# Patient Record
Sex: Male | Born: 1940 | Race: White | Hispanic: No | Marital: Single | Smoking: Former smoker
Health system: Southern US, Community
[De-identification: ages and names within clinical notes are randomized; demographics above are authoritative.]

## PROBLEM LIST (undated history)

## (undated) DIAGNOSIS — R600 Localized edema: Secondary | ICD-10-CM

## (undated) DIAGNOSIS — Z7901 Long term (current) use of anticoagulants: Secondary | ICD-10-CM

## (undated) DIAGNOSIS — J309 Allergic rhinitis, unspecified: Secondary | ICD-10-CM

## (undated) DIAGNOSIS — I509 Heart failure, unspecified: Secondary | ICD-10-CM

## (undated) DIAGNOSIS — H3530 Unspecified macular degeneration: Secondary | ICD-10-CM

## (undated) DIAGNOSIS — I482 Chronic atrial fibrillation, unspecified: Secondary | ICD-10-CM

## (undated) DIAGNOSIS — E785 Hyperlipidemia, unspecified: Secondary | ICD-10-CM

## (undated) DIAGNOSIS — I340 Nonrheumatic mitral (valve) insufficiency: Secondary | ICD-10-CM

## (undated) DIAGNOSIS — K648 Other hemorrhoids: Secondary | ICD-10-CM

## (undated) DIAGNOSIS — E039 Hypothyroidism, unspecified: Secondary | ICD-10-CM

## (undated) DIAGNOSIS — G4730 Sleep apnea, unspecified: Secondary | ICD-10-CM

## (undated) HISTORY — DX: Long term (current) use of anticoagulants: Z79.01

## (undated) HISTORY — DX: Unspecified macular degeneration: H35.30

## (undated) HISTORY — DX: Hyperlipidemia, unspecified: E78.5

## (undated) HISTORY — DX: Localized edema: R60.0

## (undated) HISTORY — DX: Heart failure, unspecified: I50.9

## (undated) HISTORY — DX: Chronic atrial fibrillation, unspecified: I48.20

## (undated) HISTORY — DX: Hypothyroidism, unspecified: E03.9

## (undated) HISTORY — DX: Sleep apnea, unspecified: G47.30

## (undated) HISTORY — DX: Other hemorrhoids: K64.8

## (undated) HISTORY — DX: Nonrheumatic mitral (valve) insufficiency: I34.0

## (undated) HISTORY — DX: Allergic rhinitis, unspecified: J30.9

---
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
## 1987-07-08 HISTORY — PX: TURBINATE REDUCTION: SHX6157

## 2000-11-02 ENCOUNTER — Encounter: Payer: Self-pay | Admitting: Internal Medicine

## 2000-11-02 ENCOUNTER — Inpatient Hospital Stay (HOSPITAL_COMMUNITY): Admission: EM | Admit: 2000-11-02 | Discharge: 2000-11-12 | Payer: Self-pay | Admitting: Emergency Medicine

## 2000-11-03 HISTORY — PX: CARDIAC CATHETERIZATION: SHX172

## 2001-01-15 ENCOUNTER — Ambulatory Visit (HOSPITAL_COMMUNITY): Admission: RE | Admit: 2001-01-15 | Discharge: 2001-01-15 | Payer: Self-pay | Admitting: Cardiovascular Disease

## 2001-03-09 HISTORY — PX: DOPPLER ECHOCARDIOGRAPHY: SHX263

## 2001-04-06 DIAGNOSIS — K648 Other hemorrhoids: Secondary | ICD-10-CM

## 2001-04-06 HISTORY — DX: Other hemorrhoids: K64.8

## 2004-09-10 HISTORY — PX: US ECHOCARDIOGRAPHY: HXRAD669

## 2004-09-20 HISTORY — PX: CARDIOVASCULAR STRESS TEST: SHX262

## 2004-10-29 ENCOUNTER — Ambulatory Visit (HOSPITAL_COMMUNITY): Admission: RE | Admit: 2004-10-29 | Discharge: 2004-10-30 | Payer: Self-pay | Admitting: Ophthalmology

## 2005-07-07 HISTORY — PX: EYE SURGERY: SHX253

## 2006-02-15 IMAGING — CR DG CHEST 2V
2 series · 2 of 2 positions shown · non-contrast
Comparison: None.

CLINICAL DATA: Retinal detachment. Preoperative respiratory evaluation. Smoker
with history of hypertension.

CHEST - 2 VIEW  10/29/2004:

[view not recorded (1 of 2)]
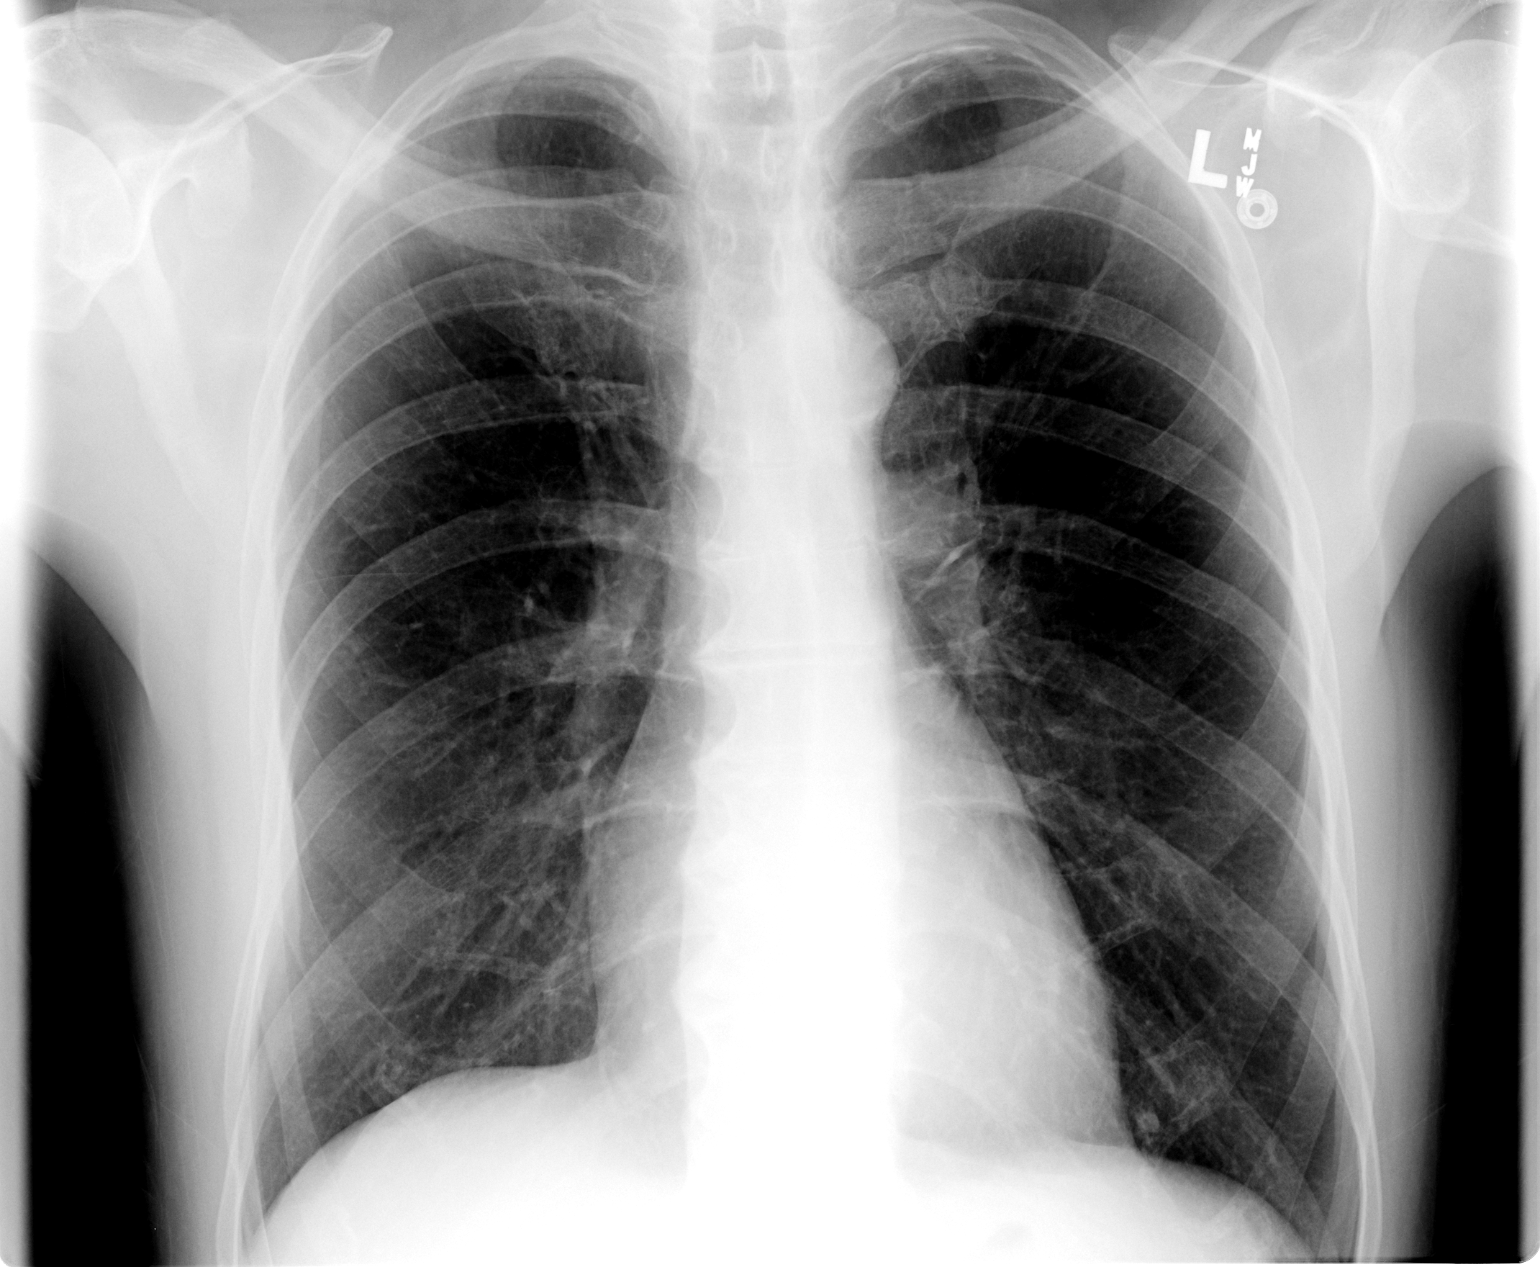

[view not recorded (2 of 2)]
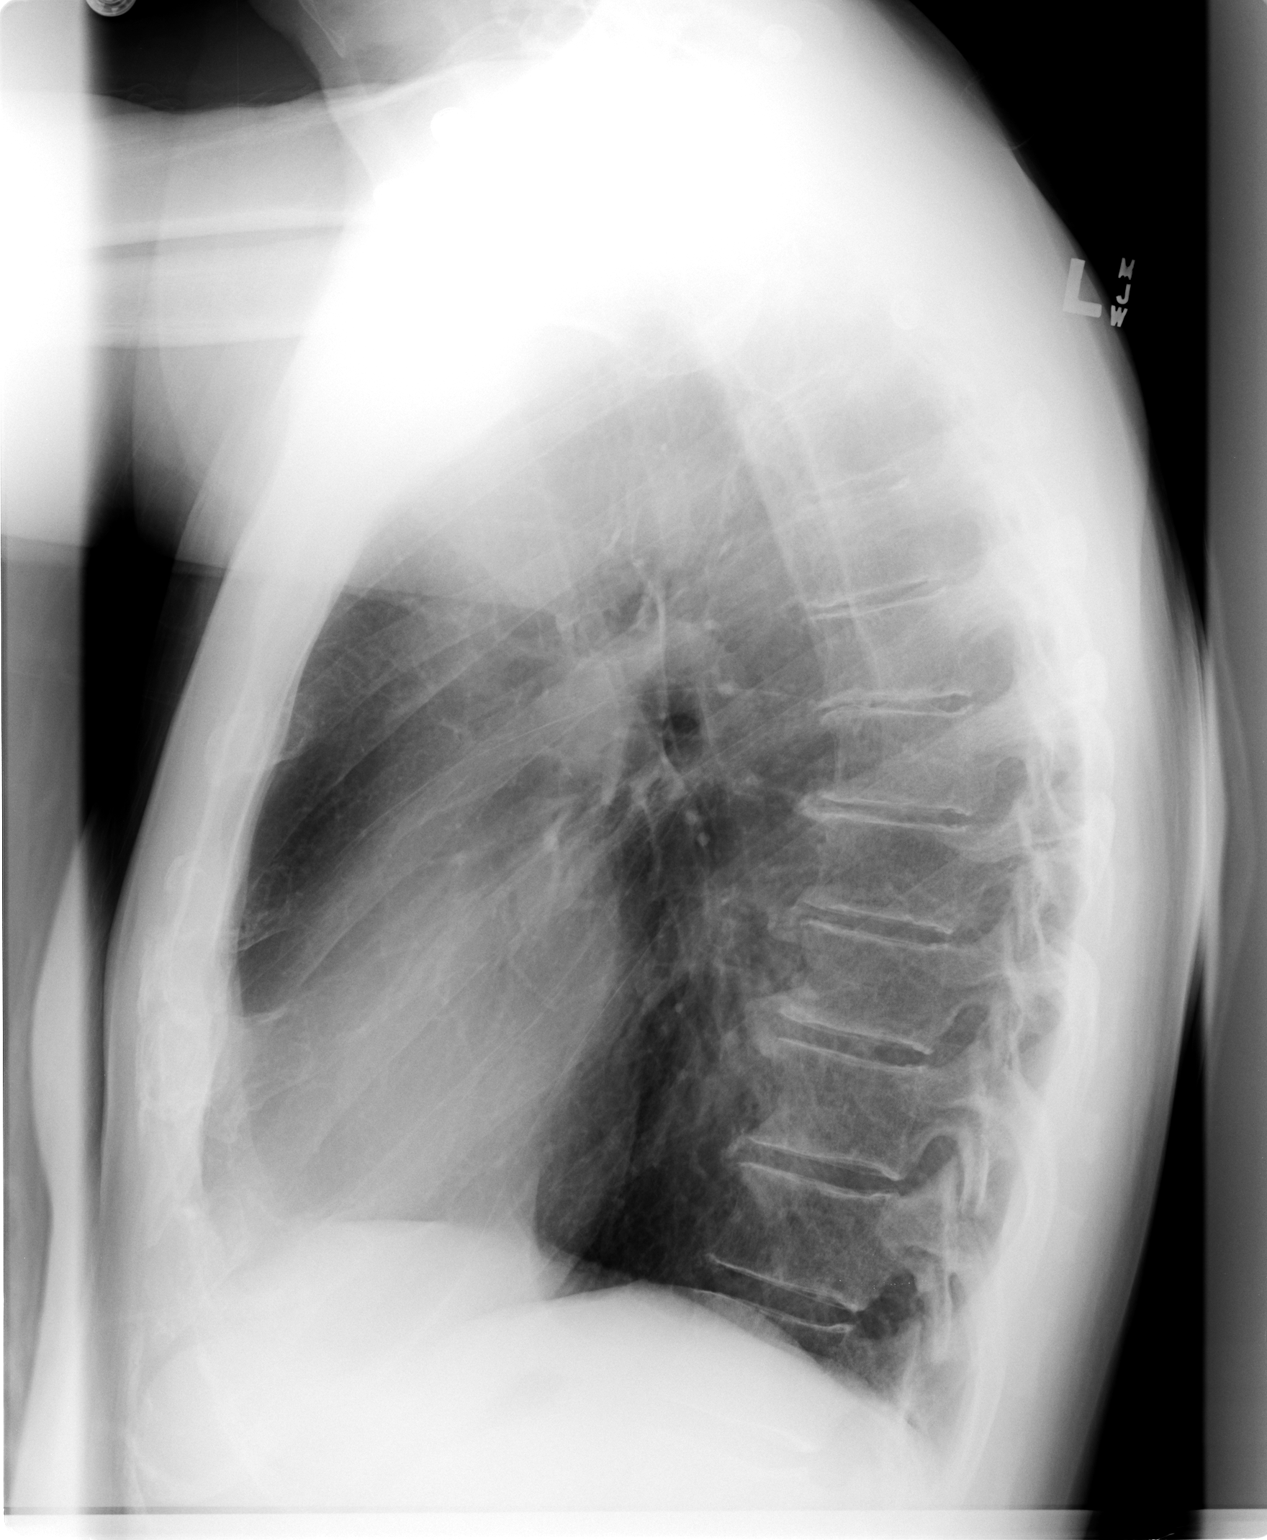

[2 of 2 positions shown; findings below may reference images not displayed]

FINDINGS: The heart size is normal. Mild thoracic aortic atherosclerosis is
present. The hilar and mediastinal contours are otherwise unremarkable. The
lungs are hyperinflated with mildly prominent bronchovascular markings
diffusely. What is either a calcified granuloma or calcification in the anterior
6th rib costal cartilage is noted overlying the left lung base on the PA view.
Degenerative changes are noted in the thoracic spine.
IMPRESSION: COPD. No evidence of acute disease.

## 2007-07-08 HISTORY — PX: OTHER SURGICAL HISTORY: SHX169

## 2010-02-26 ENCOUNTER — Ambulatory Visit: Payer: Self-pay | Admitting: Cardiovascular Disease

## 2010-03-26 ENCOUNTER — Ambulatory Visit: Payer: Self-pay | Admitting: Cardiovascular Disease

## 2010-04-23 ENCOUNTER — Ambulatory Visit: Payer: Self-pay | Admitting: Cardiovascular Disease

## 2010-05-21 ENCOUNTER — Ambulatory Visit: Payer: Self-pay | Admitting: Cardiology

## 2010-06-18 ENCOUNTER — Ambulatory Visit: Payer: Self-pay | Admitting: Cardiology

## 2010-07-22 ENCOUNTER — Ambulatory Visit: Payer: Self-pay | Admitting: Cardiology

## 2010-07-24 ENCOUNTER — Ambulatory Visit: Payer: Self-pay | Admitting: Cardiovascular Disease

## 2010-08-20 ENCOUNTER — Encounter (INDEPENDENT_AMBULATORY_CARE_PROVIDER_SITE_OTHER): Payer: Medicare Other

## 2010-08-20 DIAGNOSIS — Z7901 Long term (current) use of anticoagulants: Secondary | ICD-10-CM

## 2010-08-20 DIAGNOSIS — I4891 Unspecified atrial fibrillation: Secondary | ICD-10-CM

## 2010-09-03 ENCOUNTER — Encounter (INDEPENDENT_AMBULATORY_CARE_PROVIDER_SITE_OTHER): Payer: Medicare Other

## 2010-09-03 DIAGNOSIS — I4891 Unspecified atrial fibrillation: Secondary | ICD-10-CM

## 2010-09-03 DIAGNOSIS — Z7901 Long term (current) use of anticoagulants: Secondary | ICD-10-CM

## 2010-09-27 ENCOUNTER — Other Ambulatory Visit: Payer: Self-pay | Admitting: *Deleted

## 2010-09-27 DIAGNOSIS — I10 Essential (primary) hypertension: Secondary | ICD-10-CM

## 2010-09-27 MED ORDER — METOPROLOL TARTRATE 25 MG PO TABS: ORAL_TABLET | ORAL | Status: DC

## 2010-09-27 NOTE — Telephone Encounter (Signed)
REFILL PER FAX FROM PHARMACY  

## 2010-10-01 ENCOUNTER — Ambulatory Visit (INDEPENDENT_AMBULATORY_CARE_PROVIDER_SITE_OTHER): Payer: Medicare Other | Admitting: *Deleted

## 2010-10-01 DIAGNOSIS — I482 Chronic atrial fibrillation, unspecified: Secondary | ICD-10-CM | POA: Insufficient documentation

## 2010-10-01 DIAGNOSIS — I4891 Unspecified atrial fibrillation: Secondary | ICD-10-CM

## 2010-10-01 DIAGNOSIS — Z7901 Long term (current) use of anticoagulants: Secondary | ICD-10-CM

## 2010-10-01 LAB — POCT INR: INR: 2.2

## 2010-10-18 ENCOUNTER — Other Ambulatory Visit: Payer: Self-pay | Admitting: Cardiovascular Disease

## 2010-10-18 DIAGNOSIS — I10 Essential (primary) hypertension: Secondary | ICD-10-CM

## 2010-10-18 NOTE — Telephone Encounter (Signed)
Fax received from pharmacy. Refill completed. Jodette Mirielle Byrum RN  

## 2010-11-05 ENCOUNTER — Ambulatory Visit (INDEPENDENT_AMBULATORY_CARE_PROVIDER_SITE_OTHER): Payer: Medicare Other | Admitting: *Deleted

## 2010-11-05 DIAGNOSIS — I4891 Unspecified atrial fibrillation: Secondary | ICD-10-CM

## 2010-11-05 LAB — POCT INR: INR: 2.4

## 2010-11-12 ENCOUNTER — Other Ambulatory Visit: Payer: Self-pay | Admitting: Cardiovascular Disease

## 2010-11-12 NOTE — Telephone Encounter (Signed)
Fax received from pharmacy. Refill completed. Jodette Kaimana Lurz RN  

## 2010-11-22 NOTE — Consult Note (Signed)
[place]. [facility]  Patient:    Craig Simon, Craig Simon                      MRN: [id number] Proc. Date: 11/05/00 Adm. Date:  11/05/00 Attending:  Doylene Canning. Ladona Ridgel, M.D. [facility] Dictator:   Chinita Pester, N.P. CC:         Vesta Mixer, Montez Hageman., M.D.  Barry Dienes Eloise Harman, M.D.  Doylene Canning. Ladona Ridgel, M.D. [facility]   Consultation Report  REASON FOR CONSULTATION:  Atrial fibrillation VT.  HISTORY OF PRESENT ILLNESS:  This is a 70 year old gentleman with a past medical history of atrial fibrillation probably chronic since 1988 with controlled rate up until this point.  The patient had elected to be treated with aspirin instead of Coumadin.  He presented to his primary cares office complaining of lower extremity edema, PND, orthopnea, dyspnea on exertion with chest tightness for the past 2-3 weeks.  EKG performed and showed a atrial fibrillation with a rapid ventricular response.  He also developed nonsustained VT, monomorphic runs, which were long.  He was placed on a lidocaine drip.  Catheterization performed showed no critical coronary disease with an EF of 35-40%, mild to moderate MR.  He is asymptomatic with his runs of VT although he becomes hypotensive and diaphoretic later which was thought to be secondary to beta blockers, lidocaine and/or possibly a vagal response post catheterization.  He had a few episodes of presyncope in the past which lasted with the last episode being 5 years ago after drinking "tea".  He denied palpitations at that time.  PAST MEDICAL HISTORY:  Positive for atrial fibrillation and erectile dysfunction.  PAST SURGICAL HISTORY:  None.  SOCIAL HISTORY:  He is engaged.  Negative tobacco or alcohol.  He is employed as a Research scientist (medical).  FAMILY HISTORY:  Positive for CHF and CAD.  MEDICATIONS: 1. Lopressor 25 q.12h. 2. Lasix 40 daily. 3. Digoxin 0.125 daily. 4. Altace 2.5 daily.  Prior to admission he was taking: 5. Viagra 6. Aspirin.  The  patient also in hospital is taking: 7. Aspirin 81 daily 8. K-Dur 20 mEq b.i.d.  ALLERGIES:  No known drug allergies.  REVIEW OF SYSTEMS:  No visual changes.  Cardiovascular:  Positive chest tightness. Respiratory:  Positive dyspnea on exertion, shortness of breath, paroxysmal nocturnal dyspnea on admission.  GI:  No nausea, vomiting, and diarrhea.  GU:  Negative dysuria, negative diabetes.  LABORATORY DATA:  White blood count 10.9, H&H 13.7 and 40.7.  Platelets 137. Sodium 141, potassium 3.6, chloride 106, CO2 29, glucose 117, BUN 16, creatinine 1.3.  Magnesium 1.9, calcium 8.4.  Chest x-ray on November 02, 2000, showed bilateral atelectasis.  Catheterization shows an EF of 35-40% with no critical coronary disease.  Telemetry shows ventricular tachycardia which is monomorphic with a cycle length of 380.  PHYSICAL EXAMINATION:  VITAL SIGNS:  Blood pressure 100/50, pulse 70, respiratory rate 20.  Telemetry shows A fibrillation.  GENERAL:  This a well-developed 70 year old male lying in bed in no acute distress.  HEENT:  Sclerae clear.  NECK:  Supple.  Nontender, no bruits.  CARDIOVASCULAR:  Rate is irregular.  CHEST:  Lungs clear to auscultation bilateral.  ABDOMEN:  Soft, round, nontender.  Normoactive bowel sounds.  EXTREMITIES:  Show no edema.  NEUROLOGIC:  Awake, alert and oriented x 3.  ASSESSMENT: 1. Nonsustained ventricular tachycardia. Cycle length 380, monomorphic, 14    beats, 8 beats x 2 and asymptomatic at the time. 2. 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  Cardiac catheterization had showed no    critical disease. 3. Atrial fibrillation of unknown duration whether rate controlled.  RECOMMENDATIONS: 1. Echocardiogram left ventriculogram. 2. Convert patient to a normal sinus rhythm if possible after anticoagulation    has been obtained which may also improve patients overall ejection     fraction to keep him in a normal sinus rhythm.  If he were fail possibly    dofetilide it may thought to start him on amiodarone with Coumadin    anticoagulation and readmit him for a DC cardioversion in 6-8 weeks with    followup 2-D echocardiograms to see if there is an improvement of his LV    function. DD:  11/05/00 TD:  11/06/00 Job: [id number]

## 2010-11-22 NOTE — Op Note (Signed)
NAMEJAXX, HUISH NO.:  [id number]   MEDICAL RECORD NO.:  [id number]          PATIENT TYPE:  OIB   LOCATION:  5731                         FACILITY:  [facility]   PHYSICIAN:  Guadelupe Sabin, M.D.DATE OF BIRTH:  July 10, 1940   DATE OF PROCEDURE:  10/29/2004  DATE OF DISCHARGE:  10/30/2004                                 OPERATIVE REPORT   PREOPERATIVE DIAGNOSIS:  Rhegmatogenous retinal detachment, right eye; also,  a proliferative vitreoretinopathy.   POSTOPERATIVE DIAGNOSIS:  Rhegmatogenous retinal detachment, right eye;  also, a proliferative vitreoretinopathy.   NAME OF OPERATION:  Pars plana vitrectomy using vitreous infusion suction  cutter, scleral buckling using solid silicone implants #277 and 240,  vitreous-air exchange.   SURGEON:  Guadelupe Sabin, M.D.   ASSISTANT:  Nurse.   ANESTHESIA:  General.   OPHTHALMOSCOPY:  As previously described.   OPERATIVE PROCEDURE:  After the patient was prepped and draped, lid traction  sutures were placed in the right upper and lower lids.  A lid speculum was  inserted.  A peritomy was performed adjacent to the limbus 360 degrees.  The  subconjunctival tissue was cleaned and a 4-0 silk traction sutures placed on  the superior, inferior and medial rectus muscles.  It was felt that the  lateral rectus muscle would need to be removed due to the position of the  retinal break under the lateral rectus muscle.  A 4-0 chromic catgut suture  was placed in the center of the tendon and each side of the muscle tendon  was whipped with the 4-0 chromic suture.  The muscle was then severed from  its anatomic insertion and a bulldog clamp applied to the sutures which were  laid aside.  A 4-0 silk suture was then placed in the tendinous insertion of  the muscle for traction purposes.  Indirect ophthalmoscopy was then  performed revealing a clear vitreous and the temporal retinal detachment.  The large proliferative  vitreoretinal retinopathy star-fold was noted.  The  macular area was still attached.  The sclera was inspected and felt to be  extremely thin.  It was decided that lamellar scleral dissection could not  be carried out in this thin sclera.  The retinal break was then localized  with indirect ophthalmoscopy and treated with cryoapplication.  A #240 solid  silicone encircling band was placed about the globe approximately at the  equator and anchored in place with 5-0 white sutures at the 2, 8 and 4  o'clock position.  A #277 solid silicone implant was then placed under the  240 band extending from the 8:30 to 11 o'clock position.  A total of three 4-  0 green and one 5-0 white Dacron sutures were placed with 3-0 catgut  reinforcements loosely over the implant this well.  Attention was then paid  to the anterior segment and the ocular microscope aligned for vitrectomy  surgery.  Three sclerotomy sites were then prepared at the 10, 2 and 8  o'clock position 3.5 mm from the limbus using the MVR blade.  The 4-mm  vitreous infusion terminal was secured in place  at the 8 o'clock position  with a 5-0 green Mersilene suture.  The fiberoptic light pipe was inserted  at the 2 o'clock position and the handpiece of the vitreous infusion suction  cutter at the 10 o'clock position.  Slow vitreous infusion and suction  cutting were begun from an anterior direction toward the retina.  After the  anterior vitreous, which was noted to be extremely fibrinous, was partially  removed, it was elected to insert Perfluoron fluid to stabilize the macula  and posterior pole.  Further vitrectomy removal was performed.  Gradually,  the retinal breaks could be seen on the temporal side and attention was  particularly paid to remove the vitreous traction from the tear itself.  The  horseshoe tear flap was then amputated from its tractional pulling of  vitreous forming a round hole without the vitreous traction.  It was  then  elected to increase the Perfluoron level to the level just posterior to the  retinal break; this was performed and then a Perfluoron-air exchange  performed.  The sclerotomy sites were temporarily closed and with indirect  ophthalmoscopy, the number 277 band move posteriorly and was inspected; it  was felt that this was in good position under the implant.  It was then  elected to secure and tightened the mattress sutures which had been placed  covering the implant, creating a scleral indentation.  The tension of the  encircling #240 silicone band was also adjusted.  The band itself was tied  with 2 sutures at the 8 o'clock position.  A good buckling indentation was  created and the fundus inspected revealing no further Perfluoron.  A small  amount of subretinal fluid was noted just posterior to the implant, but this  did not extend to the posterior pole and macular area.  The vitreous  instruments were withdrawn and the sclerotomy sites are closed with 7-0  Vicryl sutures.  The lateral rectus muscle was then reattached.  Tenon's  capsule was pulled forward in the 4 quadrants and tied as a separate layer  with a 6-0 chromic catgut suture.  The conjunctiva was then pulled forward  and closed with a running 6-0 chromic suture.  Neosporin ophthalmic solution  was irrigated under the Tenon's capsule and around the implants.  The  conjunctiva was closed and prior to conjunctival closure, Depo-Garamycin and  dexamethasone were injected in the sub-tenon space inferiorly.  Maxitrol and  atropine ointment were instilled in the conjunctival cul-de-sac.  Duration  of procedure and anesthesia administration -- 2 hours.  The patient  tolerated the procedure well in general and left the operating room for the  recovery room and subsequently to the 23-hour observation unit.  The patient  was to be placed on his left side to allow proper positioning of the intravitreal air bubble.      HNJ/MEDQ   D:  10/31/2004  T:  11/01/2004  Job:  [id number]   cc:   Charlton Haws, M.D.   Estelle June. Neva Seat, M.D.  [address] [place]  [place] [address]  Fax: [phone]

## 2010-11-22 NOTE — Discharge Summary (Signed)
[place]. [facility]  Patient:    Craig Simon, Craig Simon                      MRN: [id number] Adm. Date:  [id number] Disc. Date: [id number] Attending:  Koren Bound CC:         Nathen May, M.D., [facility]  Rodrigo Ran, M.D.   Discharge Summary  ADMISSION DIAGNOSES: 1. Congestive heart failure. 2. Atrial fibrillation.  DISCHARGE DIAGNOSES: 1. 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 2. Atrial fibrillation/sick sinus syndrome. 3. Nonsustained ventricular tachycardia.  DISCHARGE MEDICATIONS: 1. Coumadin 5 mg tablets with 1/2 tablet a day. 2. Amiodarone 200 mg twice a day. 3. Lanoxin 0.125 mg a day. 4. Altace 2.5 mg a day. 5. Lasix 40 mg a day. 6. Potassium chloride 20 mEq twice a day.  ACTIVITY:  The patient has been instructed to walk every day.  DIET:  He is to eat a low salt, low fat and low cholesterol diet.  FOLLOWUP:  He is to see Dr. Elease Hashimoto in one week.  We will measure a PT in several days.  HISTORY OF PRESENT ILLNESS:  Mr. Craig Simon is a 70 year old gentleman with a past history of palpitations.  He was admitted with new-onset heart failure. Please see dictated H&P for further details.  HOSPITAL COURSE:  #1 - CONGESTIVE HEART FAILURE:  The patient was admitted with some new-onset congestive heart failure.  He did not have any episodes of chest pain.  The following day, he underwent heart catheterization which revealed relatively smooth and normal coronary arteries.  He had three sets of cardiac enzymes which were negative for myocardial infarction.  The patient was found to have markedly hypocontractile left ventricle with an ejection fraction of around 30%.  The patient was started on standard heart failure medications and improved progressively.  He will be discharged on the above-noted medications.  I anticipate that we will add Coreg in the next several weeks.  #2 - ATRIAL FIBRILLATION:  The  patient was noted to have atrial fibrillation with a rapid ventricular response.  He also had episodes of nonsustained ventricular tachycardia.  In light of this cardiomyopathy, we obtained an electrophysiology consult.  Dr. Graciela Husbands and Dr. Bruna Potter opinion was that the patient had a nonischemic cardiomyopathy and would not necessarily benefit from ICD placement.  It was felt that perhaps this atrial fibrillation with a rapid ventricular response was a major cause or at least a major contributor to his dilated cardiomyopathy.  We performed a transesophageal echocardiogram as part of a planned TEE/cardioversion.  He was found to have significant spontaneous contrast in the left and right atrium.  He was not cardioverted because of the presence of smoke.  We will send him on three weeks of Coumadin and will anticipate outpatient cardioversion.  We have loaded him on amiodarone which he will take on a daily basis.  He has also been on Coumadin and is currently therapeutic with an INR of 3.0.  His TSH level was found to be normal.  His other medical problems are stable.  He will be followed up by Dr. Waynard Edwards and by Dr. Elease Hashimoto and an outpatient. DD:  11/12/00 TD:  11/13/00 Job: [id number]

## 2010-11-22 NOTE — H&P (Signed)
[place]. [facility]  Patient:    Craig Simon, Craig Simon                      MRN: [id number] Adm. Date:  11/02/00 Attending:  Rodrigo Ran, M.D. CC:         Barry Dienes. Eloise Harman, M.D.   History and Physical  PRIMARY CARE PHYSICIAN:  Barry Dienes. Eloise Harman, M.D.  CHIEF COMPLAINT:  Swelling of the lower extremities and ankles and dyspnea.  HISTORY OF PRESENT ILLNESS:  Mr. Weaver is a pleasant, 70 year old male with a history of chronic atrial fibrillation, erectile dysfunction, and allergic rhinitis. He was noted in January of this year to have atrial fibrillation and slightly elevated blood pressure. He had good rate control at that time. He elected to take aspirin instead of Coumadin for stroke prophylaxis at that time. The patient progressed in his normal state of health until approximately 2 to 3 weeks ago. Since that time, he has developed significant pitting lower extremity edema from the mid calves to the ankles and feet. Furthermore, he has had significant orthopnea for the last 2 to 3 weeks, as well as some paroxysmal nocturnal dyspnea and some nonproductive cough. He has had dyspnea on exertion, as well. Furthermore, he reports for the last 2 to 3 weeks a little bit of chest tightness. It comes and goes. He has had it for a good part of today. There is no definite radiation noted. He cannot relate this to being exertional or not. He denies any fevers or chills. However, he did feel hot a little bit recently. He presented to the office today where his blood pressure was 130/88. He had a 99% oxygen saturation on room air. He was noted to clinically be in congestive heart failure. An EKG showed atrial fibrillation with rapid ventricular response and some possible inferior and lateral ischemic changes by EKG. He is to be admitted to a [facility] telemetry bed for further evaluation and treatment.  PAST MEDICAL HISTORY: 1. Chronic atrial fibrillation. 2. Allergic  rhinitis. 3. Erectile dysfunction. 4. He denies any previous cardiac workup other than electrocardiograms in    office visits here at our practice.  ALLERGIES:  No known allergies.  CURRENT MEDICATIONS: 1. Aspirin 81 mg daily. 2. Viagra 100 mg p.r.n. 3. Over-the-counter decongestant used p.r.n.  SOCIAL HISTORY:  He is engaged to a friend. He is a Research scientist (medical). He denies any tobacco, alcohol, or drug use.  FAMILY HISTORY:  Not obtained at this time.  PHYSICAL EXAMINATION:  VITAL SIGNS:  Blood pressure 130/88; pulse was initially taken as 82, however, EKG reveals heart rate in the 140s; weight 186 1/2 pounds; 99% saturation on room air.  GENERAL:  He is in no acute distress.  NECK:  He did have JVD to the angle of the jaw.  HEART:  Tachycardic and irregular with no definite murmur.  LUNGS:  He has coarse breath sounds bilaterally.  ABDOMEN:  Benign. There is no definite hepatosplenomegaly, although there is some slight tenderness to deep palpation in the right upper quadrant. He has no lymphadenopathy.  EXTREMITIES:  There was 3+ pitting ankle and foot edema bilaterally.  NEUROLOGICAL:  He is alert and oriented x 4. Cranial nerves 2 through 12 are intact, and he has no focal neurologic deficits.  LABORATORY DATA:  EKG shows atrial fibrillation with a rapid ventricular response with a rate of about 140 beats per minute. He has a normal axis. He does have evidence of  left ventricular hypertrophy and old age-indeterminate septal Q waves which were present on his January EKG. Furthermore, there is possibly some mild T wave inversion noted in the lateral leads and flattened T waves in the inferior leads.  Other laboratory and x-ray data are pending at the time of his admission.  ASSESSMENT AND PLAN: 1. Cardiovascular. Mr. Lucena has chronic atrial fibrillation. However, he    presents with rapid ventricular response. Furthermore, he has associated    congestive heart  failure and possible unstable angina. We will admit to a    telemetry bed and diurese with intravenous Lasix. We will obtain serial    cardiac enzymes and electrocardiograms to attempt to rule out myocardial    infarction. He will need a surface echocardiogram and will most likely    require cardiac consultation for help in deciding whether further workup is    needed at this time. We will place him on a Cardizem drip in the acute    period to help control his heart rate. 2. Ethics. Mr. Jump is full code status. Despite his fairly serious    underlying condition, he has remained fairly stable over the last 2 to 3    weeks and, hopefully, will be able to do well with appropriate treatment.DD: 11/02/00 TD:  11/02/00 Job: [id number]

## 2010-11-22 NOTE — Discharge Summary (Signed)
Craig Simon, BALDUS NO.:  [id number]   MEDICAL RECORD NO.:  [id number]          PATIENT TYPE:  OIB   LOCATION:  5731                         FACILITY:  [facility]   PHYSICIAN:  Guadelupe Sabin, M.D.DATE OF BIRTH:  1941/06/14   DATE OF ADMISSION:  10/29/2004  DATE OF DISCHARGE:  10/30/2004                                 DISCHARGE SUMMARY   REASON FOR ADMISSION:  This was an urgent outpatient admission of this 70-  year-old white male admitted with a rhegmatogenous retinal detachment of the  right eye (see detailed admission history and physical).   HOSPITAL COURSE:  The patient was felt to be in satisfactory condition for  the proposed surgery. His prothrombin time ratio was 1.1 and it was felt to  be satisfactory for the surgical procedure. The patient was therefore taken  into the operating room where a complex posterior vitrectomy scleral  buckling procedure, combined posterior vitrectomy, and  scleral buckling  procedure was performed under general anesthesia. The procedure duration two  hours. The patient tolerated the complex procedure and was taken to the  recovery room positioned on his left side and subsequently to the 23-hour  observation unit. The patient was seen on the evening of surgery and felt to  be doing satisfactorily. Applanation tonometry was recorded at 20 mm in the  operated eye. The vitreous cavity appeared to be filled with air and the  retina attached. The small amount of subretinal fluid which had been left at  the end of the surgery was completely absorbed. The tear itself was felt to  be on the central portion of the implant and in good position. The Star  proliferative vitreoretinal fold had completely flattened and was not  visible.   The patient was again seen on the morning after surgery and felt to be in  satisfactory condition for discharge. He was given a printed list of  discharge instructions on the care and use the operated  eye. Postoperative  positioning of the left eye was stressed due to the positioning of the air  bubble to tamponade the retinal tear. The patient seemed to understand his  need for limitation.   FOLLOW-UP APPOINTMENTS:  My office 24 hours.   DISCHARGE INSTRUCTIONS:  The patient is to withhold his Coumadin therapy  until seen in the office.   DISCHARGE DIAGNOSIS:  Rhegmatogenous retinal detachment right eye.   SECONDARY DIAGNOSIS:  Atrial fibrillation and congestive heart failure on  anticoagulation.   DISCHARGE MEDICATIONS:  1.  Discharge ocular medications included TobraDex and Cyclomydril      ophthalmic solutions 1 drop four times a day 5 minutes apart and      Maxitrol and atropine ointment at bedtime.  2.  The patient was to use Tylenol 2 tablets every 2-4 hours as needed for      pain.      HNJ/MEDQ  D:  10/31/2004  T:  10/31/2004  Job:  [id number]   cc:   Charlton Haws, M.D.   Estelle June. Neva Seat, M.D.  [address] [place]  [place] [address]  Fax:  [phone] 

## 2010-11-22 NOTE — Consult Note (Signed)
[place]. [facility]  Patient:    DADEN, MAHANY                      MRN: [id number] Adm. Date:  [id number] Attending:  Garlan Fillers CC:         Barry Dienes. Eloise Harman, M.D.   Consultation Report  REASON FOR CONSULTATION:  Mr. Aloia is a 70 year old gentleman with a history of atrial fibrillation.  He is admitted to the hospital with a recent onset of congestive heart failure and chest pain.  HISTORY OF PRESENT ILLNESS:  The patient has had an irregular heart beat for years.  He has been on aspirin because he chose not to take Coumadin. Recently, he has had some dyspnea on exertion especially with swimming.  He has also had some mild chest tightness and some leg edema.  He is admitted to the hospital for further evaluation of these medical problems.  The patient has not had any severe episodes of chest pain but has had some chest tightness.  These episodes have gradually worsened over the past month or so.  He recently started on a swimming program and noticed that he was not able to swim quite as far as he used to.  CURRENT MEDICATIONS:  None.  ALLERGIES:  None.  PAST MEDICAL HISTORY:  Intermittent atrial fibrillation.  SOCIAL HISTORY:  The patient has smoked cigars in the past.  Does not smoke a significant amount now.  He drinks an occasional glass of wine.  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  FAMILY HISTORY:  Family history is strongly positive for congestive heart failure and coronary artery disease.  REVIEW OF SYSTEMS:  The patient denies any weight gain or weight loss.  He denies any change in his bowel habits.  He denies any problems with his eyes, ears, nose, or throat.  Denies any PND, orthopnea, syncope, or presyncope.  He has had some chest tightness as noted above.  The patient denies any cough or cold symptoms.  He denies any hematuria,  dysuria, or blood in his stool. Denies any rash or skin nodules, change in his skin or hair.  He denies any easy bruisability.  PHYSICAL EXAMINATION:  GENERAL:  He is a middle-aged gentleman in no acute distress.  He is alert and oriented x 3 and his mood and affect are normal.  VITAL SIGNS:  Heart rate is 100, blood pressure is 120/80.  He is afebrile.  NECK:  Reveal 2+ carotids without bruits.  There is no JVD.  No thyromegaly.  LUNGS:  Clear to auscultation.  BACK:  Nontender.  HEART:  Irregularly irregular.  He has no murmurs, rubs, or gallops.  ABDOMEN:  Good bowel sounds.  He has no hepatosplenomegaly, masses, or bruits.  EXTREMITIES:  He has no clubbing or cyanosis.  He does have trace edema bilaterally.  His pulses are 1+ and are symmetric.  NEUROLOGICAL:  Nonfocal.  Cranial nerves 2 through 12 are intact and his motor and sensory function are intact.  LABORATORY DATA:  His telemetry monitoring reveals atrial fibrillation.  His heart rate has gradually decreased on the Cardizem drip.  His EKG reveals atrial fibrillation with a rapid ventricular response.  He has left ventricular hypertrophy with nonspecific ST and T-wave abnormalities.  His laboratory data is pending.  IMPRESSION:  Mr. Hobin presents with atrial fibrillation with a rapid ventricular response.  He  has not had any cardiac workup so far.  I agree with the cardiogram.  We will treat him with Lovenox for now and check cardiac enzymes.  We will continue with b.i.d. Lasix.  He may need a heart catheterization or a Cardiolite scan given his new onset congestive heart failure.  We will try to get a Cardiolite scan tomorrow.  His other medical problems are relatively stable. DD:  11/02/00 TD:  11/03/00 Job: [id number]

## 2010-11-22 NOTE — Cardiovascular Report (Signed)
[place]. [facility]  Patient:    Craig Simon, Craig Simon                      MRN: [id number] Proc. Date: 11/03/00 Adm. Date:  [id number] Attending:  Garlan Fillers CC:         Cardiac Catheterization Lab  Barry Dienes. Eloise Harman, M.D.   Cardiac Catheterization  PROCEDURE:  Coronary Angiography.  CARDIOLOGIST:  Noralyn Pick. Eden Emms, M.D. [facility]  INDICATION:  Craig Simon is a 70 year old gentleman with a history of atrial fibrillation.  He has had recent onset of chest pain/chest tightness and congestive heart failure.  Last night while in the emergency room, he had some nonsustained ventricular tachycardia.  Because of recent onset of chest pain and congestive heart failure as well as the nonsustained ventricular tachycardia, he is referred for heart catheterization.  The right femoral artery was easily cannulated using a modified Seldinger technique.  HEMODYNAMICS:  The left ventricular pressure is 97/13 with an aortic pressure of 88/52.  ANGIOGRAPHY:  The left main coronary artery is fairly normal in the proximal segment.  There is a mild taper of about 10 to 20% in the distal vessel.  Left anterior descending artery is a moderate to large vessel.  There are minor luminal irregularities with stenosis ranging between 5 and 10% throughout the course of the LAD.  There are several large diagonal branches which are unremarkable.  There is a moderate size ramus intermediate branch which has only minor luminal irregularities.  The left circumflex artery is a fairly good size vessel.  It gives rise to several obtuse marginal arteries which are normal.  It also gives off a left atrial branch.  The right coronary artery is a large and dominant vessel.  There are minor luminal irregularities in the mid segment between 20 and 25%.  The posterior descending artery and the posterolateral segment artery are fairly normal.  VENTRICULOGRAPHY:  The left ventriculogram  was performed in the 30 RAO position and reveals moderately depressed left ventricular systolic function. The ejection fraction is approximately 40%.  There is moderate mitral regurgitation.  The left atrium is moderately enlarged.  COMPLICATIONS:  None.  CONCLUSIONS: 1. Minor luminal coronary artery irregularities with no critic stenoses. 2. Moderately depressed left ventricular systolic function with an ejection    fraction of only 40%. 3. Moderate mitral regurgitation with left atrial enlargement.  We will continue with medical therapy.  It is not clear why he had nonsustained ventricular tachycardia last night.  We will continue with aggressive medical therapy including therapy for congestive heart failure.  We will treat him with low-dose beta blockers which should help with rate control as well as his nonsustained ventricular tachycardia.  We will start him on Coumadin as well as ACE inhibitors.  He will also likely need diuretics and potassium. DD:  11/03/00 TD:  11/03/00 Job: [id number]

## 2010-11-22 NOTE — H&P (Signed)
Craig Simon, LANGAN NO.:  [id number]   MEDICAL RECORD NO.:  [id number]          PATIENT TYPE:  OIB   LOCATION:  5731                         FACILITY:  [facility]   PHYSICIAN:  Guadelupe Sabin, M.D.DATE OF BIRTH:  April 02, 1941   DATE OF ADMISSION:  10/29/2004  DATE OF DISCHARGE:  10/30/2004                                HISTORY & PHYSICAL   REASON FOR ADMISSION:  This was a planned urgent outpatient admission of  this 70 year old white male admitted with a rhegmatogenous retinal  detachment of the right eye.   HISTORY OF PRESENT ILLNESS:  This patient was referred to my office by Dr.  Estelle June. Craig Simon. The patient had a past history of a progressive visual  field defect in his right eye. The patient was seen by Dr. Estelle June. Craig Simon  and found to have a retinal detachment. The patient was referred to my  office for surgical management.   PAST MEDICAL HISTORY:  The patient is under the care of Dr. Barry Dienes.  [place] and Dr. Charlton Haws. The patient has a past cardiac history of  atrial fibrillation and congestive heart failure in 2002. He currently takes  multiple medications including Coumadin, Lipitor, metoprolol, digitalis,  furosemide, Altace, and potassium.   REVIEW OF SYMPTOMS:  No current cardiorespiratory complaints.   PHYSICAL EXAMINATION:  VITAL SIGNS:  As recorded on admission. Blood  pressure 118/58, pulse 65, respirations 16, temperature 97.3.  GENERAL:  The patient is a pleasant well-developed, well-nourished white 17-  year-old male in acute ocular distress.  HEENT:  Visual acuity as recorded with correction 20/50 right eye and 20/30+  left eye. Applanation tonometry 19 mm right eye and 19 mm left eye. Slit  lamp examination shows the eyes are white and clear, clear cornea, deep and  clear anterior chamber. There was slight cortical cataract change present.  The pupils are dilated from Dr. Estelle June. Greene's examination. Dilated  detailed fundus  examination of the right eye shows a clear vitreous. There  is a temporal retinal detachment with a large horseshoe tear at the 10  o'clock position. 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 The macular area is still attached. The  optic nerve is sharply outlined of good color and the retinal blood vessels  appear normal.  CHEST:  Clear to auscultation and percussion.  HEART:  Normal sinus rhythm. No cardiomegaly and no murmurs.  ABDOMEN:  Negative.  EXTREMITIES:  Negative.   ADMISSION DIAGNOSIS:  Rhegmatogenous retinal detachment right eye.   PLAN:  The patient has discontinued his Coumadin and has been seen by Dr.  Charlton Haws and given appropriate medications to decrease his prothrombin  time. On the morning of admission, his prothrombin ratio is 1.1 which is  felt to be satisfactory for the proposed surgery. The patient has been  cleared for general anesthesia and the complex retinal detachment surgery.      HNJ/MEDQ  D:  10/31/2004  T:  10/31/2004  Job:  [id number]   cc:   Estelle June. Craig Simon, M.D.  [address]  [address]  [place]  [place] [address]  Fax: [phone]   Charlton Haws, M.D.

## 2010-12-03 ENCOUNTER — Ambulatory Visit (INDEPENDENT_AMBULATORY_CARE_PROVIDER_SITE_OTHER): Payer: Medicare Other | Admitting: *Deleted

## 2010-12-03 DIAGNOSIS — I4891 Unspecified atrial fibrillation: Secondary | ICD-10-CM

## 2010-12-03 LAB — POCT INR: INR: 2.2

## 2010-12-31 ENCOUNTER — Ambulatory Visit (INDEPENDENT_AMBULATORY_CARE_PROVIDER_SITE_OTHER): Payer: Medicare Other | Admitting: *Deleted

## 2010-12-31 DIAGNOSIS — I4891 Unspecified atrial fibrillation: Secondary | ICD-10-CM

## 2010-12-31 LAB — POCT INR: INR: 2.3

## 2011-02-03 ENCOUNTER — Ambulatory Visit (INDEPENDENT_AMBULATORY_CARE_PROVIDER_SITE_OTHER): Payer: Medicare Other | Admitting: *Deleted

## 2011-02-03 DIAGNOSIS — I4891 Unspecified atrial fibrillation: Secondary | ICD-10-CM

## 2011-02-03 LAB — POCT INR: INR: 2.6

## 2011-03-04 ENCOUNTER — Ambulatory Visit (INDEPENDENT_AMBULATORY_CARE_PROVIDER_SITE_OTHER): Payer: Medicare Other | Admitting: *Deleted

## 2011-03-04 ENCOUNTER — Encounter: Payer: Medicare Other | Admitting: *Deleted

## 2011-03-04 DIAGNOSIS — I4891 Unspecified atrial fibrillation: Secondary | ICD-10-CM

## 2011-03-17 ENCOUNTER — Other Ambulatory Visit: Payer: Self-pay | Admitting: *Deleted

## 2011-03-17 DIAGNOSIS — I10 Essential (primary) hypertension: Secondary | ICD-10-CM

## 2011-03-17 MED ORDER — POTASSIUM CHLORIDE CRYS ER 20 MEQ PO TBCR: 40.0000 meq | EXTENDED_RELEASE_TABLET | ORAL | Status: DC

## 2011-03-17 NOTE — Telephone Encounter (Signed)
Refilled Meds from fax  

## 2011-04-01 ENCOUNTER — Ambulatory Visit (INDEPENDENT_AMBULATORY_CARE_PROVIDER_SITE_OTHER): Payer: Medicare Other | Admitting: *Deleted

## 2011-04-01 DIAGNOSIS — I4891 Unspecified atrial fibrillation: Secondary | ICD-10-CM

## 2011-04-01 LAB — POCT INR: INR: 2.5

## 2011-04-28 ENCOUNTER — Ambulatory Visit (INDEPENDENT_AMBULATORY_CARE_PROVIDER_SITE_OTHER): Payer: Medicare Other | Admitting: *Deleted

## 2011-04-28 DIAGNOSIS — I4891 Unspecified atrial fibrillation: Secondary | ICD-10-CM

## 2011-04-28 LAB — POCT INR: INR: 1.9

## 2011-05-01 ENCOUNTER — Encounter: Payer: Self-pay | Admitting: Cardiovascular Disease

## 2011-05-07 ENCOUNTER — Other Ambulatory Visit (INDEPENDENT_AMBULATORY_CARE_PROVIDER_SITE_OTHER): Payer: Medicare Other | Admitting: *Deleted

## 2011-05-07 ENCOUNTER — Ambulatory Visit (INDEPENDENT_AMBULATORY_CARE_PROVIDER_SITE_OTHER): Payer: Medicare Other | Admitting: Cardiovascular Disease

## 2011-05-07 ENCOUNTER — Encounter: Payer: Self-pay | Admitting: Cardiovascular Disease

## 2011-05-07 DIAGNOSIS — I4891 Unspecified atrial fibrillation: Secondary | ICD-10-CM

## 2011-05-07 DIAGNOSIS — I10 Essential (primary) hypertension: Secondary | ICD-10-CM

## 2011-05-07 DIAGNOSIS — E785 Hyperlipidemia, unspecified: Secondary | ICD-10-CM

## 2011-05-07 DIAGNOSIS — E119 Type 2 diabetes mellitus without complications: Secondary | ICD-10-CM | POA: Insufficient documentation

## 2011-05-07 LAB — BASIC METABOLIC PANEL
Calcium: 8.7 mg/dL (ref 8.4–10.5)
Creatinine, Ser: 1 mg/dL (ref 0.4–1.5)

## 2011-05-07 LAB — LIPID PANEL
Cholesterol: 101 mg/dL (ref 0–200)
LDL Cholesterol: 29 mg/dL (ref 0–99)
Triglycerides: 119 mg/dL (ref 0.0–149.0)

## 2011-05-07 LAB — HEPATIC FUNCTION PANEL
ALT: 21 U/L (ref 0–53)
AST: 21 U/L (ref 0–37)
Alkaline Phosphatase: 38 U/L — ABNORMAL LOW (ref 39–117)
Bilirubin, Direct: 0.1 mg/dL (ref 0.0–0.3)
Total Protein: 6.4 g/dL (ref 6.0–8.3)

## 2011-05-07 NOTE — Assessment & Plan Note (Addendum)
He remains in atrial fibrillation. His rate is a little bit slow today. We will discontinue his digoxin. See him again in 6 months.

## 2011-05-07 NOTE — Assessment & Plan Note (Signed)
Has a history of hyperlipidemia. We'll check his lipid levels again when I see him in 6 months. He is to continue with his current medications.

## 2011-05-07 NOTE — Patient Instructions (Signed)
Your physician wants you to follow-up in: 6 months You will receive a reminder letter in the mail two months in advance. If you don't receive a letter, please call our office to schedule the follow-up appointment.  Your physician has recommended you make the following change in your medication:   1) stop digoxin  Your physician recommends that you return for a FASTING lipid profile: 6 months

## 2011-05-07 NOTE — Progress Notes (Signed)
Craig Simon Date of Birth  02/24/1941 [facility] [address]    [address] [place], [place]  [address] [phone]  Fax  [phone]  History of Present Illness:  Craig Simon is a 70 year old gentleman with a history of atrial fibrillation. He's done quite well from a cardiac standpoint. He's not had any episodes of chest pain or shortness of breath. He's been able to do all of his normal activities without any problems.  Current Outpatient Prescriptions on File Prior to Visit  Medication Sig Dispense Refill  . digoxin (LANOXIN) 0.125 MG tablet Take 125 mcg by mouth daily.        [name] ezetimibe (ZETIA) 10 MG tablet Take 10 mg by mouth daily.        . fish oil-omega-3 fatty acids 1000 MG capsule Take 2 g by mouth 2 (two) times daily.        . fluticasone (FLONASE) 50 MCG/ACT nasal spray Place 2 sprays into the nose as needed.        . furosemide (LASIX) 40 MG tablet Take 40 mg by mouth 3 (three) times a week.        . levothyroxine (SYNTHROID, LEVOTHROID) 150 MCG tablet Take 150 mcg by mouth daily.        [name] lisinopril (PRINIVIL,ZESTRIL) 5 MG tablet TAKE 1 TABLET EVERY DAY  90 tablet  3  . metFORMIN (GLUCOPHAGE) 500 MG tablet Take 500 mg by mouth at bedtime.        . metoprolol tartrate (LOPRESSOR) 25 MG tablet TAKE 1/2 OF 25 MG  TABLET PO TWO TIMES A DAY  60 tablet  11  . Multiple Vitamin (MULTIVITAMIN) tablet Take 1 tablet by mouth daily.        . multivitamin-lutein (OCUVITE-LUTEIN) CAPS Take 1 capsule by mouth daily.        . pioglitazone (ACTOS) 15 MG tablet Take 15 mg by mouth daily.        . potassium chloride SA (K-DUR,KLOR-CON) 20 MEQ tablet Take 2 tablets (40 mEq total) by mouth 3 (three) times a week. Monday, Wednesday, Friday BID  24 tablet  5  . rosuvastatin (CRESTOR) 20 MG tablet Take 20 mg by mouth daily.        [name] warfarin (COUMADIN) 5 MG tablet TAKE AS DIRECTED  50 tablet  4    No Known Allergies  Past Medical History  Diagnosis Date  . Chronic atrial fibrillation    . Chronic anticoagulation   . Diabetes mellitus   . Mitral regurgitation   . Hyperlipidemia   . CHF (congestive heart failure)   . Chest pain   . Leg edema     Past Surgical History  Procedure Date  . Cardiac catheterization 11/03/2000    EF 40%  . US echocardiography 09/10/2004    EF 55-60%  . Doppler echocardiography 03/09/2001    EF 50%. MODERATE LVH  . Cardiovascular stress test 09/20/2004    EF 56%. NO EVIDENCE OF ISCHEMIA    History  Smoking status  . Never Smoker   Smokeless tobacco  . Not on file    History  Alcohol Use No    No family history on file.  Reviw of Systems:  Reviewed in the HPI.  All other systems are negative.  Physical Exam: BP 113/58  Pulse 48  Ht 6\' 2"  (1.88 m)  Wt 204 lb 1.9 oz (92.588 kg)  BMI 26.21 kg/m2 The patient is alert and oriented x 3.  The mood and  affect are normal.   Skin: warm and dry.  Color is normal.    HEENT:   Normocephalic, atraumatic. He has no JVD. There is no bruits.  Lungs: His lung exam is clear.   Heart: Irregularly irregular.  His PMI is nondisplaced.    Abdomen: His abdomen is nontender.  Extremities:  He has no clubbing cyanosis or edema  Neuro:  Nonfocal, his gait is normal.    ECG: Atrial fibrillation with a slow ventricular response  Assessment / Plan:

## 2011-05-08 ENCOUNTER — Telehealth: Payer: Self-pay | Admitting: *Deleted

## 2011-05-08 NOTE — Telephone Encounter (Signed)
msg left of normal lab results

## 2011-05-08 NOTE — Telephone Encounter (Signed)
Message copied by Antony Odea on Thu May 08, 2011  4:39 PM ------      Message from: Vesta Mixer      Created: Wed May 07, 2011  8:11 PM       Excellent.

## 2011-05-09 ENCOUNTER — Other Ambulatory Visit: Payer: Self-pay | Admitting: Cardiovascular Disease

## 2011-05-16 ENCOUNTER — Encounter: Payer: Self-pay | Admitting: Gastroenterology

## 2011-05-26 ENCOUNTER — Ambulatory Visit (INDEPENDENT_AMBULATORY_CARE_PROVIDER_SITE_OTHER): Payer: Medicare Other | Admitting: *Deleted

## 2011-05-26 DIAGNOSIS — I4891 Unspecified atrial fibrillation: Secondary | ICD-10-CM

## 2011-05-26 LAB — POCT INR: INR: 2.7

## 2011-06-23 ENCOUNTER — Ambulatory Visit (INDEPENDENT_AMBULATORY_CARE_PROVIDER_SITE_OTHER): Payer: Medicare Other | Admitting: *Deleted

## 2011-06-23 DIAGNOSIS — I4891 Unspecified atrial fibrillation: Secondary | ICD-10-CM

## 2011-06-23 LAB — POCT INR: INR: 2.2

## 2011-07-21 ENCOUNTER — Ambulatory Visit (INDEPENDENT_AMBULATORY_CARE_PROVIDER_SITE_OTHER): Payer: Medicare Other | Admitting: *Deleted

## 2011-07-21 DIAGNOSIS — I4891 Unspecified atrial fibrillation: Secondary | ICD-10-CM | POA: Diagnosis not present

## 2011-07-28 DIAGNOSIS — E119 Type 2 diabetes mellitus without complications: Secondary | ICD-10-CM | POA: Diagnosis not present

## 2011-07-28 DIAGNOSIS — N529 Male erectile dysfunction, unspecified: Secondary | ICD-10-CM | POA: Diagnosis not present

## 2011-07-28 DIAGNOSIS — I4891 Unspecified atrial fibrillation: Secondary | ICD-10-CM | POA: Diagnosis not present

## 2011-08-26 ENCOUNTER — Other Ambulatory Visit: Payer: Self-pay | Admitting: *Deleted

## 2011-08-26 MED ORDER — POTASSIUM CHLORIDE CRYS ER 20 MEQ PO TBCR: 40.0000 meq | EXTENDED_RELEASE_TABLET | ORAL | Status: DC

## 2011-08-26 NOTE — Telephone Encounter (Signed)
Fax Received. Refill Completed. Evaluna Utke Chowoe (R.M.A)   

## 2011-09-01 ENCOUNTER — Ambulatory Visit (INDEPENDENT_AMBULATORY_CARE_PROVIDER_SITE_OTHER): Payer: Medicare Other | Admitting: *Deleted

## 2011-09-01 DIAGNOSIS — I4891 Unspecified atrial fibrillation: Secondary | ICD-10-CM

## 2011-09-19 ENCOUNTER — Other Ambulatory Visit: Payer: Self-pay | Admitting: *Deleted

## 2011-09-19 MED ORDER — WARFARIN SODIUM 5 MG PO TABS: 5.0000 mg | ORAL_TABLET | ORAL | Status: DC

## 2011-10-13 ENCOUNTER — Ambulatory Visit (INDEPENDENT_AMBULATORY_CARE_PROVIDER_SITE_OTHER): Payer: Medicare Other | Admitting: Pharmacist

## 2011-10-13 DIAGNOSIS — I4891 Unspecified atrial fibrillation: Secondary | ICD-10-CM | POA: Diagnosis not present

## 2011-10-20 ENCOUNTER — Telehealth: Payer: Self-pay | Admitting: Cardiovascular Disease

## 2011-10-20 DIAGNOSIS — I10 Essential (primary) hypertension: Secondary | ICD-10-CM

## 2011-10-20 NOTE — Telephone Encounter (Signed)
[facility] calling for refill of  linsinopril 5mg 

## 2011-10-21 MED ORDER — LISINOPRIL 5 MG PO TABS: 5.0000 mg | ORAL_TABLET | Freq: Every day | ORAL | Status: DC

## 2011-10-21 NOTE — Telephone Encounter (Signed)
Refilled medication

## 2011-10-21 NOTE — Telephone Encounter (Signed)
Pt calling back re rx, pt out of med today and requesting refill be called in asap

## 2011-10-22 ENCOUNTER — Other Ambulatory Visit: Payer: Self-pay | Admitting: Cardiovascular Disease

## 2011-10-22 NOTE — Telephone Encounter (Signed)
Refill- Fluticasone (FLONASE) 50 MCG/ACT nasal spray         - Furosemide (LASIX) 40 MG tablet    [facility] [phone] - [place] - [address] [phone]

## 2011-10-23 MED ORDER — FUROSEMIDE 40 MG PO TABS: 40.0000 mg | ORAL_TABLET | ORAL | Status: DC

## 2011-10-30 MED ORDER — FLUTICASONE PROPIONATE 50 MCG/ACT NA SUSP: 2.0000 | NASAL | Status: AC | PRN

## 2011-11-21 ENCOUNTER — Other Ambulatory Visit: Payer: Self-pay | Admitting: Cardiology

## 2011-11-24 ENCOUNTER — Ambulatory Visit (INDEPENDENT_AMBULATORY_CARE_PROVIDER_SITE_OTHER): Payer: Medicare Other

## 2011-11-24 DIAGNOSIS — I4891 Unspecified atrial fibrillation: Secondary | ICD-10-CM

## 2011-12-26 DIAGNOSIS — Z7901 Long term (current) use of anticoagulants: Secondary | ICD-10-CM | POA: Diagnosis not present

## 2011-12-26 DIAGNOSIS — I4891 Unspecified atrial fibrillation: Secondary | ICD-10-CM | POA: Diagnosis not present

## 2011-12-29 ENCOUNTER — Ambulatory Visit (INDEPENDENT_AMBULATORY_CARE_PROVIDER_SITE_OTHER): Payer: Medicare Other | Admitting: Cardiovascular Disease

## 2011-12-29 DIAGNOSIS — I4891 Unspecified atrial fibrillation: Secondary | ICD-10-CM

## 2012-01-26 ENCOUNTER — Other Ambulatory Visit: Payer: Self-pay | Admitting: *Deleted

## 2012-01-26 MED ORDER — POTASSIUM CHLORIDE CRYS ER 20 MEQ PO TBCR: 40.0000 meq | EXTENDED_RELEASE_TABLET | ORAL | Status: DC

## 2012-01-26 NOTE — Telephone Encounter (Signed)
Refilled klor con.

## 2012-01-29 ENCOUNTER — Ambulatory Visit (INDEPENDENT_AMBULATORY_CARE_PROVIDER_SITE_OTHER): Payer: Medicare Other | Admitting: *Deleted

## 2012-01-29 DIAGNOSIS — I4891 Unspecified atrial fibrillation: Secondary | ICD-10-CM

## 2012-01-29 LAB — POCT INR: INR: 2

## 2012-02-12 ENCOUNTER — Other Ambulatory Visit: Payer: Self-pay | Admitting: *Deleted

## 2012-02-12 MED ORDER — POTASSIUM CHLORIDE CRYS ER 20 MEQ PO TBCR: 40.0000 meq | EXTENDED_RELEASE_TABLET | ORAL | Status: DC

## 2012-02-12 NOTE — Telephone Encounter (Signed)
Pt needs appointment then refill can be made Fax Received. Refill Completed. Shalon Councilman Chowoe (R.M.A)   

## 2012-02-12 NOTE — Telephone Encounter (Signed)
Opened in Error.

## 2012-02-26 ENCOUNTER — Ambulatory Visit (INDEPENDENT_AMBULATORY_CARE_PROVIDER_SITE_OTHER): Payer: Medicare Other

## 2012-02-26 DIAGNOSIS — I4891 Unspecified atrial fibrillation: Secondary | ICD-10-CM | POA: Diagnosis not present

## 2012-02-26 LAB — POCT INR: INR: 2.1

## 2012-03-22 DIAGNOSIS — I4891 Unspecified atrial fibrillation: Secondary | ICD-10-CM | POA: Diagnosis not present

## 2012-03-22 DIAGNOSIS — G2589 Other specified extrapyramidal and movement disorders: Secondary | ICD-10-CM | POA: Diagnosis not present

## 2012-03-22 DIAGNOSIS — G4731 Primary central sleep apnea: Secondary | ICD-10-CM | POA: Diagnosis not present

## 2012-04-07 ENCOUNTER — Encounter: Payer: Self-pay | Admitting: Gastroenterology

## 2012-04-13 ENCOUNTER — Ambulatory Visit (INDEPENDENT_AMBULATORY_CARE_PROVIDER_SITE_OTHER): Payer: Medicare Other | Admitting: Pharmacist

## 2012-04-13 DIAGNOSIS — I4891 Unspecified atrial fibrillation: Secondary | ICD-10-CM

## 2012-04-13 LAB — POCT INR: INR: 1.9

## 2012-04-15 ENCOUNTER — Other Ambulatory Visit: Payer: Self-pay | Admitting: Cardiovascular Disease

## 2012-04-15 MED ORDER — POTASSIUM CHLORIDE CRYS ER 20 MEQ PO TBCR: 40.0000 meq | EXTENDED_RELEASE_TABLET | ORAL | Status: DC

## 2012-04-15 MED ORDER — WARFARIN SODIUM 5 MG PO TABS: ORAL_TABLET | ORAL | Status: DC

## 2012-04-15 MED ORDER — POTASSIUM CHLORIDE CRYS ER 20 MEQ PO TBCR: EXTENDED_RELEASE_TABLET | ORAL | Status: DC

## 2012-04-15 NOTE — Telephone Encounter (Signed)
Pt is aware he has to make an appointment to get more refills.Fax Received. Refill Completed. Theresa Wedel Chowoe (R.M.A)

## 2012-04-16 ENCOUNTER — Other Ambulatory Visit: Payer: Self-pay | Admitting: *Deleted

## 2012-04-16 MED ORDER — WARFARIN SODIUM 5 MG PO TABS: ORAL_TABLET | ORAL | Status: DC

## 2012-05-12 DIAGNOSIS — G2589 Other specified extrapyramidal and movement disorders: Secondary | ICD-10-CM | POA: Diagnosis not present

## 2012-05-12 DIAGNOSIS — I4891 Unspecified atrial fibrillation: Secondary | ICD-10-CM | POA: Diagnosis not present

## 2012-05-12 DIAGNOSIS — G4731 Primary central sleep apnea: Secondary | ICD-10-CM | POA: Diagnosis not present

## 2012-05-18 ENCOUNTER — Ambulatory Visit: Payer: Medicare Other | Admitting: Cardiovascular Disease

## 2012-05-19 ENCOUNTER — Ambulatory Visit (INDEPENDENT_AMBULATORY_CARE_PROVIDER_SITE_OTHER): Payer: Medicare Other | Admitting: *Deleted

## 2012-05-19 DIAGNOSIS — I4891 Unspecified atrial fibrillation: Secondary | ICD-10-CM

## 2012-05-19 LAB — POCT INR: INR: 1.9

## 2012-06-02 ENCOUNTER — Ambulatory Visit (INDEPENDENT_AMBULATORY_CARE_PROVIDER_SITE_OTHER): Payer: Medicare Other | Admitting: Cardiovascular Disease

## 2012-06-02 ENCOUNTER — Encounter: Payer: Self-pay | Admitting: Cardiovascular Disease

## 2012-06-02 ENCOUNTER — Ambulatory Visit (INDEPENDENT_AMBULATORY_CARE_PROVIDER_SITE_OTHER): Payer: Medicare Other | Admitting: *Deleted

## 2012-06-02 VITALS — BP 116/64 | HR 69 | Ht 74.0 in | Wt 193.2 lb

## 2012-06-02 DIAGNOSIS — I4891 Unspecified atrial fibrillation: Secondary | ICD-10-CM

## 2012-06-02 LAB — POCT INR: INR: 2.4

## 2012-06-02 NOTE — Progress Notes (Signed)
Laurelyn Sickle Date of Birth  1941-01-15 [facility] [address]    [address] [place], [place]  [address] [phone]  Fax  [phone]   Problem List: 1. Atrial Fibrillation 2. Hypothyroidism 3. Diabetes mellitus 4. Hyperlipidemia 5. Obstructive sleep apnea- wears CPAP at night  History of Present Illness:  Craig Simon is a 71 year old gentleman with a history of atrial fibrillation. He's done quite well from a cardiac standpoint. He's not had any episodes of chest pain or shortness of breath. He's been able to do all of his normal activities without any problems.  He has noticed some leg edema.  He has not been eating much salt.  Current Outpatient Prescriptions on File Prior to Visit  Medication Sig Dispense Refill  . ezetimibe (ZETIA) 10 MG tablet Take 10 mg by mouth daily.        . fish oil-omega-3 fatty acids 1000 MG capsule Take 2 g by mouth 2 (two) times daily.        . fluticasone (FLONASE) 50 MCG/ACT nasal spray Place 2 sprays into the nose as needed for rhinitis.  1 g  12  . furosemide (LASIX) 40 MG tablet Take 1 tablet (40 mg total) by mouth 3 (three) times a week.  30 tablet  4  . levothyroxine (SYNTHROID, LEVOTHROID) 150 MCG tablet Take 150 mcg by mouth daily.        [name] lisinopril (PRINIVIL,ZESTRIL) 5 MG tablet Take 1 tablet (5 mg total) by mouth daily.  90 tablet  3  . metFORMIN (GLUCOPHAGE) 500 MG tablet Take 500 mg by mouth at bedtime.        . metoprolol tartrate (LOPRESSOR) 25 MG tablet TAKE 1/2 TABLET TWO TIMES A DAY  60 tablet  6  . Multiple Vitamin (MULTIVITAMIN) tablet Take 1 tablet by mouth daily.        . multivitamin-lutein (OCUVITE-LUTEIN) CAPS Take 1 capsule by mouth daily.        . pioglitazone (ACTOS) 15 MG tablet Take 15 mg by mouth daily.        . potassium chloride SA (K-DUR,KLOR-CON) 20 MEQ tablet Taking 6 Tablets a Week  30 tablet  1  . pramipexole (MIRAPEX) 0.25 MG tablet Take 0.25 mg by mouth at bedtime.      . rosuvastatin (CRESTOR) 20 MG  tablet Take 20 mg by mouth daily.        [name] warfarin (COUMADIN) 5 MG tablet Take as directed by coumadin clinic  30 tablet  3    No Known Allergies  Past Medical History  Diagnosis Date  . Chronic atrial fibrillation   . Chronic anticoagulation   . Diabetes mellitus   . Mitral regurgitation   . Hyperlipidemia   . CHF (congestive heart failure)   . Chest pain   . Leg edema     Past Surgical History  Procedure Date  . Cardiac catheterization 11/03/2000    EF 40%  . US echocardiography 09/10/2004    EF 55-60%  . Doppler echocardiography 03/09/2001    EF 50%. MODERATE LVH  . Cardiovascular stress test 09/20/2004    EF 56%. NO EVIDENCE OF ISCHEMIA    History  Smoking status  . Never Smoker   Smokeless tobacco  . Not on file    History  Alcohol Use No    No family history on file.  Reviw of Systems:  Reviewed in the HPI.  All other systems are negative.  Physical Exam: BP 116/64  Pulse 69  Ht 6\' 2"  (1.88 m)  Wt 193 lb 3.2 oz (87.635 kg)  BMI 24.81 kg/m2 The patient is alert and oriented x 3.  The mood and affect are normal.   Skin: warm and dry.  Color is normal.    HEENT:   Normocephalic, atraumatic. He has no JVD. There is no bruits.  Lungs: His lung exam is clear.   Heart: Irregularly irregular.  His PMI is nondisplaced.    Abdomen: His abdomen is nontender.  Extremities:  Bilateral ankle edema.  Neuro:  Nonfocal, his gait is normal.    ECG: 06/02/2012-atrial fibrillation at 60 beats a minute. He has no ST or T wave changes.  Assessment / Plan:

## 2012-06-02 NOTE — Patient Instructions (Addendum)
Your physician wants you to follow-up in: 6 months  You will receive a reminder letter in the mail two months in advance. If you don't receive a letter, please call our office to schedule the follow-up appointment.  Your physician recommends that you continue on your current medications as directed. Please refer to the Current Medication list given to you today.  

## 2012-06-02 NOTE — Assessment & Plan Note (Signed)
Craig Simon is doing well. We'll continue with the same medications. His atrial fibrillation is well controlled. I seen again in 6 months for an office visit and EKG.  He is on her levels have been therapeutic.

## 2012-06-17 DIAGNOSIS — M2040 Other hammer toe(s) (acquired), unspecified foot: Secondary | ICD-10-CM | POA: Diagnosis not present

## 2012-06-21 DIAGNOSIS — Z23 Encounter for immunization: Secondary | ICD-10-CM | POA: Diagnosis not present

## 2012-06-23 ENCOUNTER — Ambulatory Visit (INDEPENDENT_AMBULATORY_CARE_PROVIDER_SITE_OTHER): Payer: Medicare Other | Admitting: *Deleted

## 2012-06-23 DIAGNOSIS — I4891 Unspecified atrial fibrillation: Secondary | ICD-10-CM | POA: Diagnosis not present

## 2012-07-01 ENCOUNTER — Encounter: Payer: Self-pay | Admitting: Cardiovascular Disease

## 2012-07-05 ENCOUNTER — Other Ambulatory Visit: Payer: Self-pay | Admitting: *Deleted

## 2012-07-05 MED ORDER — POTASSIUM CHLORIDE CRYS ER 20 MEQ PO TBCR: EXTENDED_RELEASE_TABLET | ORAL | Status: DC

## 2012-07-05 NOTE — Telephone Encounter (Signed)
Fax Received. Refill Completed. Craig Simon (R.M.A)   

## 2012-07-16 DIAGNOSIS — E119 Type 2 diabetes mellitus without complications: Secondary | ICD-10-CM | POA: Diagnosis not present

## 2012-07-16 DIAGNOSIS — E785 Hyperlipidemia, unspecified: Secondary | ICD-10-CM | POA: Diagnosis not present

## 2012-07-16 DIAGNOSIS — Z125 Encounter for screening for malignant neoplasm of prostate: Secondary | ICD-10-CM | POA: Diagnosis not present

## 2012-07-16 DIAGNOSIS — R82998 Other abnormal findings in urine: Secondary | ICD-10-CM | POA: Diagnosis not present

## 2012-07-16 DIAGNOSIS — E039 Hypothyroidism, unspecified: Secondary | ICD-10-CM | POA: Diagnosis not present

## 2012-07-19 ENCOUNTER — Other Ambulatory Visit: Payer: Self-pay | Admitting: *Deleted

## 2012-07-19 DIAGNOSIS — I10 Essential (primary) hypertension: Secondary | ICD-10-CM

## 2012-07-19 MED ORDER — LISINOPRIL 5 MG PO TABS: 5.0000 mg | ORAL_TABLET | Freq: Every day | ORAL | Status: DC

## 2012-07-19 NOTE — Telephone Encounter (Signed)
Fax Received. Refill Completed. Craig Simon (R.M.A)   

## 2012-07-20 DIAGNOSIS — I10 Essential (primary) hypertension: Secondary | ICD-10-CM | POA: Diagnosis not present

## 2012-07-20 DIAGNOSIS — M2040 Other hammer toe(s) (acquired), unspecified foot: Secondary | ICD-10-CM | POA: Diagnosis not present

## 2012-07-20 HISTORY — PX: HAMMER TOE SURGERY: SHX385

## 2012-07-23 DIAGNOSIS — I4891 Unspecified atrial fibrillation: Secondary | ICD-10-CM | POA: Diagnosis not present

## 2012-07-23 DIAGNOSIS — Z125 Encounter for screening for malignant neoplasm of prostate: Secondary | ICD-10-CM | POA: Diagnosis not present

## 2012-07-23 DIAGNOSIS — Z0000 Encounter for general adult medical examination without abnormal findings: Secondary | ICD-10-CM | POA: Diagnosis not present

## 2012-07-23 DIAGNOSIS — Z1331 Encounter for screening for depression: Secondary | ICD-10-CM | POA: Diagnosis not present

## 2012-07-23 DIAGNOSIS — E119 Type 2 diabetes mellitus without complications: Secondary | ICD-10-CM | POA: Diagnosis not present

## 2012-07-23 DIAGNOSIS — E785 Hyperlipidemia, unspecified: Secondary | ICD-10-CM | POA: Diagnosis not present

## 2012-07-26 DIAGNOSIS — Z1212 Encounter for screening for malignant neoplasm of rectum: Secondary | ICD-10-CM | POA: Diagnosis not present

## 2012-07-26 DIAGNOSIS — M2040 Other hammer toe(s) (acquired), unspecified foot: Secondary | ICD-10-CM | POA: Diagnosis not present

## 2012-07-28 ENCOUNTER — Ambulatory Visit (INDEPENDENT_AMBULATORY_CARE_PROVIDER_SITE_OTHER): Payer: Medicare Other | Admitting: *Deleted

## 2012-07-28 DIAGNOSIS — I4891 Unspecified atrial fibrillation: Secondary | ICD-10-CM

## 2012-07-28 LAB — POCT INR: INR: 1.8

## 2012-08-03 ENCOUNTER — Telehealth: Payer: Self-pay | Admitting: *Deleted

## 2012-08-03 NOTE — Telephone Encounter (Signed)
Pt is on Coumadin.  I called to set him for a visit with the extender to discuss this. LMOM for pt to call back

## 2012-08-03 NOTE — Telephone Encounter (Signed)
Appointment made with Craig Simon to discuss his Coumadin on 08-09-12 at 9:30  I tried to get him in at 8 or 8:30 but he refused several times.  States, "That's too early for me and I have an appointment to get stitches out of my foot at 11:30 a.m. I don't want to Simon any earlier than that."  Pt states, "I have an understanding with my cardiologist- [name] off of Coumadin 5 days before my procedure and go right back on it.  I was just off of it 2 weeks ago for foot surgery."  I explained that it is our policy and for his safety to see our PA so she can discuss his hx with him and notify the cardiologist of the planned colonoscopy  I also stated it was especially important to that because he was just off of his Coumadin recently and it is for his safety that we check with his cardiologist ourselves.

## 2012-08-05 ENCOUNTER — Ambulatory Visit (AMBULATORY_SURGERY_CENTER): Payer: Medicare Other | Admitting: *Deleted

## 2012-08-05 VITALS — Ht 74.0 in | Wt 202.4 lb

## 2012-08-05 DIAGNOSIS — Z1211 Encounter for screening for malignant neoplasm of colon: Secondary | ICD-10-CM

## 2012-08-05 MED ORDER — NA SULFATE-K SULFATE-MG SULF 17.5-3.13-1.6 GM/177ML PO SOLN: ORAL | Status: DC

## 2012-08-06 ENCOUNTER — Encounter: Payer: Self-pay | Admitting: *Deleted

## 2012-08-09 ENCOUNTER — Telehealth: Payer: Self-pay | Admitting: *Deleted

## 2012-08-09 ENCOUNTER — Encounter: Payer: Self-pay | Admitting: Physician Assistant

## 2012-08-09 ENCOUNTER — Ambulatory Visit (INDEPENDENT_AMBULATORY_CARE_PROVIDER_SITE_OTHER): Payer: Medicare Other | Admitting: Physician Assistant

## 2012-08-09 VITALS — BP 132/60 | HR 59 | Ht 74.0 in | Wt 198.0 lb

## 2012-08-09 DIAGNOSIS — I509 Heart failure, unspecified: Secondary | ICD-10-CM

## 2012-08-09 DIAGNOSIS — Z7901 Long term (current) use of anticoagulants: Secondary | ICD-10-CM | POA: Diagnosis not present

## 2012-08-09 DIAGNOSIS — Z1211 Encounter for screening for malignant neoplasm of colon: Secondary | ICD-10-CM | POA: Diagnosis not present

## 2012-08-09 NOTE — Patient Instructions (Addendum)
You have been scheduled for a colonoscopy with propofol. Please follow written instructions given to you at your visit today.  If you use inhalers (even only as needed) or a CPAP machine, please bring them with you on the day of your procedure.  We will contact Dr. Elease Hashimoto regarding the coumadin medication.  We will call you once he responds to our coumadin letter.

## 2012-08-09 NOTE — Progress Notes (Signed)
Reviewed and agree with management plan.  Roberta Kelly T. Anyelina Claycomb, MD FACG 

## 2012-08-09 NOTE — Telephone Encounter (Signed)
I left a message on the patient's answering machine at home. I advised I heard from Dr. Elease Hashimoto and he wants the pt to hold the coumadin for 5 days prior to the colonosocpy.  He can stop it on Sat 08-14-2012 and resume it the day after the colonoscopy, which would be Friday 08-20-2012.  I left my name and number if he had any questions.

## 2012-08-09 NOTE — Telephone Encounter (Signed)
  08/09/2012    RE: Craig Simon DOB: 06/15/1941 MRN: [id number]   Dear Dr Leodis Sias,    We have scheduled the above patient for an endoscopic procedure. Our records show that he is on anticoagulation therapy.   Please advise as to how long the patient may come off his therapy of Coumadin prior to the procedure, which is scheduled for 08-19-2012.  Please fax back/ or route the completed form to [facility] CMA at [phone].   Sincerely,  Ashby Dawes    Amy Esterwood PA-C

## 2012-08-09 NOTE — Progress Notes (Signed)
Subjective:    Patient ID: Craig Simon, male    DOB: 1941-01-27, 72 y.o.   MRN: [id number]  HPI Craig Simon is a pleasant 72 year old white male who was referred by his primary care physician for followup colonoscopy. He had colonoscopy with Dr.Stark in October of 2002 and this was a normal exam. He is scheduled for followup colonoscopy on February 13 but office visit was required because he is on chronic Coumadin. 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 He is followed by Dr. Teodoro Kil. He states that he is come off of his Coumadin multiple times in the past for procedure surgeries etc. without difficulty . He has no current GI complaints     Review of Systems  Constitutional: Negative.   HENT: Negative.   Eyes: Negative.   Respiratory: Negative.   Cardiovascular: Negative.   Gastrointestinal: Negative.   Genitourinary: Negative.   Musculoskeletal: Positive for gait problem.  Neurological: Negative.   Hematological: Negative.   Psychiatric/Behavioral: Negative.    Outpatient Prescriptions Prior to Visit  Medication Sig Dispense Refill  . ezetimibe (ZETIA) 10 MG tablet Take 10 mg by mouth daily.        . fish oil-omega-3 fatty acids 1000 MG capsule Take 2 g by mouth 2 (two) times daily.        . fluticasone (FLONASE) 50 MCG/ACT nasal spray Place 2 sprays into the nose as needed for rhinitis.  1 g  12  . furosemide (LASIX) 40 MG tablet Take 1 tablet (40 mg total) by mouth 3 (three) times a week.  30 tablet  4  . levothyroxine (SYNTHROID, LEVOTHROID) 150 MCG tablet Take 150 mcg by mouth daily.        [name] lisinopril (PRINIVIL,ZESTRIL) 5 MG tablet Take 1 tablet (5 mg total) by mouth daily.  90 tablet  3  . metFORMIN (GLUCOPHAGE) 500 MG tablet Take 500 mg by mouth at bedtime.        . metoprolol tartrate (LOPRESSOR) 25 MG tablet TAKE 1/2 TABLET TWO TIMES A DAY  60 tablet  6  . Multiple Vitamin (MULTIVITAMIN) tablet Take 1  tablet by mouth daily.        . multivitamin-lutein (OCUVITE-LUTEIN) CAPS Take 1 capsule by mouth daily.        . Na Sulfate-K Sulfate-Mg Sulf (SUPREP BOWEL PREP) SOLN suprep as directed.  No substitutions  354 mL  0  . pioglitazone (ACTOS) 15 MG tablet Take 15 mg by mouth daily.        . potassium chloride SA (K-DUR,KLOR-CON) 20 MEQ tablet Taking 6 Tablets a Week  30 tablet  5  . rosuvastatin (CRESTOR) 20 MG tablet Take 20 mg by mouth daily.        . SF 1.1 % GEL dental gel       . warfarin (COUMADIN) 5 MG tablet Take as directed by coumadin clinic  30 tablet  3   Last reviewed on 08/09/2012 12:05 PM by Sammuel Cooper, PA  No Known Allergies Patient Active Problem List  Diagnosis  . Atrial fibrillation  . Hyperlipidemia  . Diabetes mellitus  . CHF (congestive heart failure)   History  Substance Use Topics  . Smoking status: Never Smoker   . Smokeless tobacco: Never Used  . Alcohol Use: 0.0 oz/week     Comment: 1 glass a night    family history is negative for Colon cancer and Stomach cancer.  Objective:   Physical Exam well-developed older white male in no acute distress, pleasant blood pressure 132/60 pulse 59 height 6 foot 2 weight 198. HEENT nontraumatic normocephalic EOMI PERRLA sclera anicteric neck is supple there is no JVD. Not further examined today       Assessment & Plan:  #53  72 year old male referred for screening colonoscopy currently asymptomatic and with negative colonoscopy October 2002. 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 Russella Dar on February 13. He has already received prep instructions etc. Procedure was discussed in detail with the patient and he is agreeable to proceed He will need to come off of his Coumadin 5 days prior to the procedure, and we will obtain consent from  his cardiologist Dr. Melburn Popper

## 2012-08-09 NOTE — Telephone Encounter (Signed)
Dr. Deloris Ping Nahser sent me back a response regarding the coumadin and the colonoscopy.  He stated, " Craig Simon may hold his coumadin for 5 days prior to the colonoscopy.  Restart the day following the procedure if there is no significant bleeding from the colonoscopy."

## 2012-08-10 ENCOUNTER — Telehealth: Payer: Self-pay | Admitting: *Deleted

## 2012-08-10 NOTE — Telephone Encounter (Signed)
Recommendation is noted and I see MA Peterman reviewed.

## 2012-08-10 NOTE — Telephone Encounter (Signed)
Called the patient on his cell phone this morning.  I had to leave a message again. ( Left one on his home answering machine yesterday 2-3). I advised Dr. Elease Hashimoto said he can hold his Coumadin for 5 days prior to 08-19-2012, date of colonoscopy. He can resume it the day after the procedure if no significant bleeding after the procedure.

## 2012-08-17 DIAGNOSIS — K4090 Unilateral inguinal hernia, without obstruction or gangrene, not specified as recurrent: Secondary | ICD-10-CM | POA: Diagnosis not present

## 2012-08-17 DIAGNOSIS — N529 Male erectile dysfunction, unspecified: Secondary | ICD-10-CM | POA: Diagnosis not present

## 2012-08-17 DIAGNOSIS — R3129 Other microscopic hematuria: Secondary | ICD-10-CM | POA: Diagnosis not present

## 2012-08-18 DIAGNOSIS — M2040 Other hammer toe(s) (acquired), unspecified foot: Secondary | ICD-10-CM | POA: Diagnosis not present

## 2012-08-19 ENCOUNTER — Encounter: Payer: Medicare Other | Admitting: Gastroenterology

## 2012-08-23 ENCOUNTER — Encounter (INDEPENDENT_AMBULATORY_CARE_PROVIDER_SITE_OTHER): Payer: Medicare Other | Admitting: Ophthalmology

## 2012-08-23 DIAGNOSIS — H3530 Unspecified macular degeneration: Secondary | ICD-10-CM

## 2012-08-23 DIAGNOSIS — E11319 Type 2 diabetes mellitus with unspecified diabetic retinopathy without macular edema: Secondary | ICD-10-CM

## 2012-08-23 DIAGNOSIS — H43819 Vitreous degeneration, unspecified eye: Secondary | ICD-10-CM

## 2012-08-23 DIAGNOSIS — E1139 Type 2 diabetes mellitus with other diabetic ophthalmic complication: Secondary | ICD-10-CM

## 2012-08-23 DIAGNOSIS — H2510 Age-related nuclear cataract, unspecified eye: Secondary | ICD-10-CM

## 2012-08-23 DIAGNOSIS — H2700 Aphakia, unspecified eye: Secondary | ICD-10-CM

## 2012-08-25 ENCOUNTER — Ambulatory Visit (INDEPENDENT_AMBULATORY_CARE_PROVIDER_SITE_OTHER): Payer: Medicare Other | Admitting: *Deleted

## 2012-08-25 DIAGNOSIS — I4891 Unspecified atrial fibrillation: Secondary | ICD-10-CM | POA: Diagnosis not present

## 2012-08-25 DIAGNOSIS — R3129 Other microscopic hematuria: Secondary | ICD-10-CM | POA: Diagnosis not present

## 2012-08-25 DIAGNOSIS — N200 Calculus of kidney: Secondary | ICD-10-CM | POA: Diagnosis not present

## 2012-08-25 DIAGNOSIS — N281 Cyst of kidney, acquired: Secondary | ICD-10-CM | POA: Diagnosis not present

## 2012-09-15 ENCOUNTER — Ambulatory Visit (INDEPENDENT_AMBULATORY_CARE_PROVIDER_SITE_OTHER): Payer: Medicare Other | Admitting: *Deleted

## 2012-09-15 DIAGNOSIS — I4891 Unspecified atrial fibrillation: Secondary | ICD-10-CM

## 2012-09-15 LAB — POCT INR: INR: 2.8

## 2012-09-17 ENCOUNTER — Other Ambulatory Visit: Payer: Self-pay | Admitting: *Deleted

## 2012-09-17 MED ORDER — WARFARIN SODIUM 5 MG PO TABS: ORAL_TABLET | ORAL | Status: DC

## 2012-10-12 ENCOUNTER — Ambulatory Visit (INDEPENDENT_AMBULATORY_CARE_PROVIDER_SITE_OTHER): Payer: Medicare Other

## 2012-10-12 DIAGNOSIS — I4891 Unspecified atrial fibrillation: Secondary | ICD-10-CM

## 2012-10-12 LAB — POCT INR: INR: 2.5

## 2012-11-09 ENCOUNTER — Ambulatory Visit (INDEPENDENT_AMBULATORY_CARE_PROVIDER_SITE_OTHER): Payer: Medicare Other | Admitting: Pharmacist

## 2012-11-09 DIAGNOSIS — I4891 Unspecified atrial fibrillation: Secondary | ICD-10-CM

## 2012-12-14 ENCOUNTER — Other Ambulatory Visit: Payer: Self-pay | Admitting: *Deleted

## 2012-12-14 MED ORDER — FUROSEMIDE 40 MG PO TABS: 40.0000 mg | ORAL_TABLET | ORAL | Status: DC

## 2012-12-14 NOTE — Telephone Encounter (Signed)
Fax Received. Refill Completed. Craig Simon (R.M.A)   

## 2012-12-16 ENCOUNTER — Other Ambulatory Visit (HOSPITAL_COMMUNITY): Payer: Self-pay | Admitting: *Deleted

## 2012-12-16 MED ORDER — POTASSIUM CHLORIDE CRYS ER 20 MEQ PO TBCR: EXTENDED_RELEASE_TABLET | ORAL | Status: DC

## 2012-12-16 NOTE — Telephone Encounter (Signed)
Fax Received. Refill Completed. Craig Simon (R.M.A)   

## 2012-12-21 ENCOUNTER — Ambulatory Visit (INDEPENDENT_AMBULATORY_CARE_PROVIDER_SITE_OTHER): Payer: Medicare Other | Admitting: Pharmacist

## 2012-12-21 DIAGNOSIS — I4891 Unspecified atrial fibrillation: Secondary | ICD-10-CM | POA: Diagnosis not present

## 2013-01-19 ENCOUNTER — Other Ambulatory Visit: Payer: Self-pay | Admitting: *Deleted

## 2013-01-19 MED ORDER — METOPROLOL TARTRATE 25 MG PO TABS: 12.5000 mg | ORAL_TABLET | Freq: Two times a day (BID) | ORAL | Status: DC

## 2013-01-19 NOTE — Telephone Encounter (Signed)
Fax Received. Refill Completed. Craig Simon (R.M.A)   

## 2013-01-26 ENCOUNTER — Telehealth: Payer: Self-pay | Admitting: Cardiovascular Disease

## 2013-01-26 NOTE — Telephone Encounter (Signed)
Left msg to call back.

## 2013-01-26 NOTE — Telephone Encounter (Signed)
New problem   Pt wants to know if he needs to stop his coumadin for a dental procedure

## 2013-02-01 ENCOUNTER — Ambulatory Visit (INDEPENDENT_AMBULATORY_CARE_PROVIDER_SITE_OTHER): Payer: Medicare Other | Admitting: *Deleted

## 2013-02-01 DIAGNOSIS — I4891 Unspecified atrial fibrillation: Secondary | ICD-10-CM | POA: Diagnosis not present

## 2013-02-02 ENCOUNTER — Telehealth: Payer: Self-pay | Admitting: *Deleted

## 2013-02-02 NOTE — Telephone Encounter (Signed)
Message copied by Antony Odea on Wed Feb 02, 2013 12:32 PM ------      Message from: Vesta Mixer      Created: Tue Feb 01, 2013  5:32 PM         We ask the dentist because some dentist will do extractions on full dose coumadin.  If he is not comfortable doing this, Craig. releford may hold his coumadin for 5 days prior to dental procedure and restart the night after the extraction.                  ----- Message -----         From: Jeannine Kitten, RN         Sent: 02/01/2013   4:27 PM           To: Vesta Mixer, MD            Craig Simon is scheduled for dental extraction of one tooth on August 21st and I called and spoke with Craig Simon at Dr Burgess Estelle office and she states that  DR Burgess Estelle does not decide regarding holding pts coumadin that he wants pts cardiologist to address this . Please advise      Thank you       Lelon Perla RN      Dr Burgess Estelle office number  [phone]       ------

## 2013-02-02 NOTE — Telephone Encounter (Signed)
Called Dr Elton Sin office and updated them.

## 2013-02-07 ENCOUNTER — Other Ambulatory Visit: Payer: Self-pay

## 2013-02-07 MED ORDER — POTASSIUM CHLORIDE CRYS ER 20 MEQ PO TBCR: EXTENDED_RELEASE_TABLET | ORAL | Status: DC

## 2013-02-17 ENCOUNTER — Telehealth: Payer: Self-pay | Admitting: *Deleted

## 2013-02-17 NOTE — Telephone Encounter (Signed)
Pt called asking if needed to hold coumadin scheduled for dental extraction of one tooth on August 21st. Read to pt note from Dr Elease Hashimoto and this note was called into Dr Burgess Estelle office on July 30th and updated office with information from Dr Elease Hashimoto.Pt states he has not been instructed from Dr Burgess Estelle office to hold coumadin prior to extraction. This nurse called Dr Burgess Estelle office today August 14th and the office is closed until August 18th . Called number given to covering dentist office and they said they could not order to hold coumadin as they did not know the pt.Talked with Dr Weston Brass and she instructed that since he has had no directions from Dr Burgess Estelle office to hold coumadin to continue dose as ordered and this nurse called pt back with these directions and he states understanding. Pt states he is going to call the dentist office on Monday 02/21/2013 Instructed to keep appt at clinic on 02/22/2013

## 2013-02-22 ENCOUNTER — Ambulatory Visit (INDEPENDENT_AMBULATORY_CARE_PROVIDER_SITE_OTHER): Payer: Medicare Other | Admitting: *Deleted

## 2013-02-22 DIAGNOSIS — I4891 Unspecified atrial fibrillation: Secondary | ICD-10-CM | POA: Diagnosis not present

## 2013-02-22 LAB — POCT INR: INR: 2.5

## 2013-02-28 ENCOUNTER — Other Ambulatory Visit: Payer: Self-pay | Admitting: *Deleted

## 2013-02-28 MED ORDER — WARFARIN SODIUM 5 MG PO TABS: ORAL_TABLET | ORAL | Status: DC

## 2013-03-22 ENCOUNTER — Ambulatory Visit (INDEPENDENT_AMBULATORY_CARE_PROVIDER_SITE_OTHER): Payer: Medicare Other

## 2013-03-22 DIAGNOSIS — I4891 Unspecified atrial fibrillation: Secondary | ICD-10-CM | POA: Diagnosis not present

## 2013-04-19 ENCOUNTER — Ambulatory Visit (INDEPENDENT_AMBULATORY_CARE_PROVIDER_SITE_OTHER): Payer: Medicare Other | Admitting: General Practice

## 2013-04-19 DIAGNOSIS — I4891 Unspecified atrial fibrillation: Secondary | ICD-10-CM

## 2013-04-19 LAB — POCT INR: INR: 3.1

## 2013-04-22 DIAGNOSIS — Z23 Encounter for immunization: Secondary | ICD-10-CM | POA: Diagnosis not present

## 2013-04-27 ENCOUNTER — Other Ambulatory Visit: Payer: Self-pay

## 2013-04-27 MED ORDER — POTASSIUM CHLORIDE CRYS ER 20 MEQ PO TBCR: EXTENDED_RELEASE_TABLET | ORAL | Status: DC

## 2013-05-18 ENCOUNTER — Ambulatory Visit (INDEPENDENT_AMBULATORY_CARE_PROVIDER_SITE_OTHER): Payer: Medicare Other | Admitting: Pharmacist

## 2013-05-18 DIAGNOSIS — I4891 Unspecified atrial fibrillation: Secondary | ICD-10-CM | POA: Diagnosis not present

## 2013-05-18 LAB — POCT INR: INR: 2.2

## 2013-06-15 ENCOUNTER — Ambulatory Visit (INDEPENDENT_AMBULATORY_CARE_PROVIDER_SITE_OTHER): Payer: Medicare Other | Admitting: Pharmacist

## 2013-06-15 DIAGNOSIS — I4891 Unspecified atrial fibrillation: Secondary | ICD-10-CM

## 2013-06-23 ENCOUNTER — Other Ambulatory Visit: Payer: Self-pay | Admitting: Cardiovascular Disease

## 2013-07-06 ENCOUNTER — Ambulatory Visit (INDEPENDENT_AMBULATORY_CARE_PROVIDER_SITE_OTHER): Payer: Medicare Other | Admitting: Pharmacist

## 2013-07-06 ENCOUNTER — Other Ambulatory Visit: Payer: Self-pay

## 2013-07-06 DIAGNOSIS — I4891 Unspecified atrial fibrillation: Secondary | ICD-10-CM | POA: Diagnosis not present

## 2013-07-06 DIAGNOSIS — I10 Essential (primary) hypertension: Secondary | ICD-10-CM

## 2013-07-06 MED ORDER — LISINOPRIL 5 MG PO TABS: 5.0000 mg | ORAL_TABLET | Freq: Every day | ORAL | Status: DC

## 2013-07-17 ENCOUNTER — Other Ambulatory Visit: Payer: Self-pay | Admitting: Cardiovascular Disease

## 2013-07-21 DIAGNOSIS — E119 Type 2 diabetes mellitus without complications: Secondary | ICD-10-CM | POA: Diagnosis not present

## 2013-07-21 DIAGNOSIS — Z125 Encounter for screening for malignant neoplasm of prostate: Secondary | ICD-10-CM | POA: Diagnosis not present

## 2013-07-21 DIAGNOSIS — I4891 Unspecified atrial fibrillation: Secondary | ICD-10-CM | POA: Diagnosis not present

## 2013-07-21 DIAGNOSIS — E785 Hyperlipidemia, unspecified: Secondary | ICD-10-CM | POA: Diagnosis not present

## 2013-07-21 DIAGNOSIS — E039 Hypothyroidism, unspecified: Secondary | ICD-10-CM | POA: Diagnosis not present

## 2013-07-27 ENCOUNTER — Ambulatory Visit (INDEPENDENT_AMBULATORY_CARE_PROVIDER_SITE_OTHER): Payer: Medicare Other | Admitting: *Deleted

## 2013-07-27 DIAGNOSIS — I4891 Unspecified atrial fibrillation: Secondary | ICD-10-CM

## 2013-07-27 LAB — POCT INR: INR: 2.4

## 2013-07-28 DIAGNOSIS — Z1331 Encounter for screening for depression: Secondary | ICD-10-CM | POA: Diagnosis not present

## 2013-07-28 DIAGNOSIS — E039 Hypothyroidism, unspecified: Secondary | ICD-10-CM | POA: Diagnosis not present

## 2013-07-28 DIAGNOSIS — I4891 Unspecified atrial fibrillation: Secondary | ICD-10-CM | POA: Diagnosis not present

## 2013-07-28 DIAGNOSIS — E119 Type 2 diabetes mellitus without complications: Secondary | ICD-10-CM | POA: Diagnosis not present

## 2013-07-28 DIAGNOSIS — E785 Hyperlipidemia, unspecified: Secondary | ICD-10-CM | POA: Diagnosis not present

## 2013-07-28 DIAGNOSIS — N529 Male erectile dysfunction, unspecified: Secondary | ICD-10-CM | POA: Diagnosis not present

## 2013-07-28 DIAGNOSIS — Z79899 Other long term (current) drug therapy: Secondary | ICD-10-CM | POA: Diagnosis not present

## 2013-07-28 DIAGNOSIS — R3129 Other microscopic hematuria: Secondary | ICD-10-CM | POA: Diagnosis not present

## 2013-07-28 DIAGNOSIS — Z0000 Encounter for general adult medical examination without abnormal findings: Secondary | ICD-10-CM | POA: Diagnosis not present

## 2013-07-29 DIAGNOSIS — Z1212 Encounter for screening for malignant neoplasm of rectum: Secondary | ICD-10-CM | POA: Diagnosis not present

## 2013-08-12 ENCOUNTER — Other Ambulatory Visit: Payer: Self-pay

## 2013-08-12 DIAGNOSIS — I10 Essential (primary) hypertension: Secondary | ICD-10-CM

## 2013-08-12 MED ORDER — LISINOPRIL 5 MG PO TABS: 5.0000 mg | ORAL_TABLET | Freq: Every day | ORAL | Status: DC

## 2013-08-16 ENCOUNTER — Other Ambulatory Visit: Payer: Self-pay | Admitting: Cardiovascular Disease

## 2013-08-18 ENCOUNTER — Other Ambulatory Visit: Payer: Self-pay | Admitting: Cardiovascular Disease

## 2013-08-24 ENCOUNTER — Ambulatory Visit (INDEPENDENT_AMBULATORY_CARE_PROVIDER_SITE_OTHER): Payer: Medicare Other | Admitting: Ophthalmology

## 2013-08-24 ENCOUNTER — Ambulatory Visit (INDEPENDENT_AMBULATORY_CARE_PROVIDER_SITE_OTHER): Payer: Medicare Other | Admitting: *Deleted

## 2013-08-24 ENCOUNTER — Other Ambulatory Visit: Payer: Self-pay

## 2013-08-24 DIAGNOSIS — I4891 Unspecified atrial fibrillation: Secondary | ICD-10-CM | POA: Diagnosis not present

## 2013-08-24 DIAGNOSIS — E11319 Type 2 diabetes mellitus with unspecified diabetic retinopathy without macular edema: Secondary | ICD-10-CM

## 2013-08-24 DIAGNOSIS — H33009 Unspecified retinal detachment with retinal break, unspecified eye: Secondary | ICD-10-CM

## 2013-08-24 DIAGNOSIS — E1139 Type 2 diabetes mellitus with other diabetic ophthalmic complication: Secondary | ICD-10-CM | POA: Diagnosis not present

## 2013-08-24 DIAGNOSIS — H35039 Hypertensive retinopathy, unspecified eye: Secondary | ICD-10-CM

## 2013-08-24 DIAGNOSIS — I10 Essential (primary) hypertension: Secondary | ICD-10-CM | POA: Diagnosis not present

## 2013-08-24 DIAGNOSIS — H43819 Vitreous degeneration, unspecified eye: Secondary | ICD-10-CM

## 2013-08-24 DIAGNOSIS — H2510 Age-related nuclear cataract, unspecified eye: Secondary | ICD-10-CM

## 2013-08-24 DIAGNOSIS — H3530 Unspecified macular degeneration: Secondary | ICD-10-CM | POA: Diagnosis not present

## 2013-08-24 DIAGNOSIS — E1165 Type 2 diabetes mellitus with hyperglycemia: Secondary | ICD-10-CM

## 2013-08-24 LAB — POCT INR: INR: 3.2

## 2013-08-24 MED ORDER — POTASSIUM CHLORIDE CRYS ER 20 MEQ PO TBCR: EXTENDED_RELEASE_TABLET | ORAL | Status: DC

## 2013-09-09 ENCOUNTER — Other Ambulatory Visit: Payer: Self-pay | Admitting: *Deleted

## 2013-09-09 DIAGNOSIS — I10 Essential (primary) hypertension: Secondary | ICD-10-CM

## 2013-09-09 MED ORDER — LISINOPRIL 5 MG PO TABS: 5.0000 mg | ORAL_TABLET | Freq: Every day | ORAL | Status: DC

## 2013-09-14 ENCOUNTER — Ambulatory Visit (INDEPENDENT_AMBULATORY_CARE_PROVIDER_SITE_OTHER): Payer: Medicare Other | Admitting: Pharmacist

## 2013-09-14 DIAGNOSIS — I4891 Unspecified atrial fibrillation: Secondary | ICD-10-CM | POA: Diagnosis not present

## 2013-09-14 LAB — POCT INR: INR: 2.4

## 2013-09-17 ENCOUNTER — Other Ambulatory Visit: Payer: Self-pay | Admitting: Cardiovascular Disease

## 2013-09-19 ENCOUNTER — Other Ambulatory Visit: Payer: Self-pay | Admitting: Cardiovascular Disease

## 2013-10-07 ENCOUNTER — Ambulatory Visit (INDEPENDENT_AMBULATORY_CARE_PROVIDER_SITE_OTHER): Payer: Medicare Other | Admitting: Cardiovascular Disease

## 2013-10-07 ENCOUNTER — Encounter: Payer: Self-pay | Admitting: Cardiovascular Disease

## 2013-10-07 ENCOUNTER — Ambulatory Visit (INDEPENDENT_AMBULATORY_CARE_PROVIDER_SITE_OTHER): Payer: Medicare Other | Admitting: *Deleted

## 2013-10-07 VITALS — BP 128/62 | HR 55 | Ht 74.0 in | Wt 200.0 lb

## 2013-10-07 DIAGNOSIS — E785 Hyperlipidemia, unspecified: Secondary | ICD-10-CM

## 2013-10-07 DIAGNOSIS — I4891 Unspecified atrial fibrillation: Secondary | ICD-10-CM | POA: Diagnosis not present

## 2013-10-07 DIAGNOSIS — I509 Heart failure, unspecified: Secondary | ICD-10-CM

## 2013-10-07 DIAGNOSIS — Z5181 Encounter for therapeutic drug level monitoring: Secondary | ICD-10-CM

## 2013-10-07 LAB — POCT INR: INR: 2.8

## 2013-10-07 NOTE — Patient Instructions (Addendum)
Check to see if one of these medications he is on their formulary.  Pradaxa 150 twice a day Xarelto 20 mg a day Eliquis 5 mg twice a day. [place]  Your physician wants you to follow-up in: 1 year  You will receive a reminder letter in the mail two months in advance. If you don't receive a letter, please call our office to schedule the follow-up appointment.   Your physician recommends that you continue on your current medications as directed. Please refer to the Current Medication list given to you today.

## 2013-10-07 NOTE — Assessment & Plan Note (Signed)
Craig Simon is doing very well. His atrial fibrillation remained stable. We'll continue with the same medications. I seen again in one year.

## 2013-10-07 NOTE — Progress Notes (Signed)
Craig Simon Date of Birth  March 16, 1941 [facility] [address]    Craig Simon, [place]  [address] [phone]  Fax  [phone]   Problem List: 1. Atrial Fibrillation 2. Hypothyroidism 3. Diabetes mellitus 4. Hyperlipidemia 5. Obstructive sleep apnea- wears CPAP at night  History of Present Illness:  Craig Simon is a 73 year old gentleman with a history of atrial fibrillation. He's done quite well from a cardiac standpoint. He's not had any episodes of chest pain or shortness of breath. He's been able to do all of his normal activities without any problems.  He has noticed some leg edema.  He has not been eating much salt.  October 07, 2013:  Craig Simon is doing well.    He is able to do all of his normal activities without any significant problems. He denies any chest pain or shortness breath.    Current Outpatient Prescriptions on File Prior to Visit  Medication Sig Dispense Refill  . ezetimibe (ZETIA) 10 MG tablet Take 10 mg by mouth daily.        . fish oil-omega-3 fatty acids 1000 MG capsule Take 2 g by mouth 2 (two) times daily.        . fluticasone (FLONASE) 50 MCG/ACT nasal spray Place 2 sprays into the nose as needed for rhinitis.  1 g  12  . furosemide (LASIX) 40 MG tablet Take 1 tablet (40 mg total) by mouth 3 (three) times a week.  36 tablet  4  . KLOR-CON M20 20 MEQ tablet TAKE 6 TABLETS BY MOUTH EVERY WEEK  24 tablet  0  . levothyroxine (SYNTHROID, LEVOTHROID) 150 MCG tablet Take 150 mcg by mouth daily.        [name] lisinopril (PRINIVIL,ZESTRIL) 5 MG tablet Take 1 tablet (5 mg total) by mouth daily.  30 tablet  0  . metFORMIN (GLUCOPHAGE) 500 MG tablet Take 500 mg by mouth at bedtime.        . metoprolol tartrate (LOPRESSOR) 25 MG tablet Take 0.5 tablets (12.5 mg total) by mouth 2 (two) times daily.  60 tablet  4  . Multiple Vitamin (MULTIVITAMIN) tablet Take 1 tablet by mouth daily.        . multivitamin-lutein (OCUVITE-LUTEIN) CAPS Take 1 capsule by mouth daily.         . pioglitazone (ACTOS) 15 MG tablet Take 15 mg by mouth daily.        . rosuvastatin (CRESTOR) 20 MG tablet Take 20 mg by mouth daily.        . SF 1.1 % GEL dental gel       . warfarin (COUMADIN) 5 MG tablet TAKE AS DIRECTED BY COUMADIN CLINIC  30 tablet  3   No current facility-administered medications on file prior to visit.    No Known Allergies  Past Medical History  Diagnosis Date  . Chronic atrial fibrillation   . Chronic anticoagulation   . Diabetes mellitus   . Mitral regurgitation   . Hyperlipidemia   . CHF (congestive heart failure)   . Chest pain   . Leg edema   . Sleep apnea     has cpap but cannot tolerate  . Internal hemorrhoids without mention of complication [id number]    Past Surgical History  Procedure Laterality Date  . Cardiac catheterization  11/03/2000    EF 40%  . US echocardiography  09/10/2004    EF 55-60%  . Doppler echocardiography  03/09/2001    EF 50%. MODERATE LVH  .  Cardiovascular stress test  09/20/2004    EF 56%. NO EVIDENCE OF ISCHEMIA  . Adenoidectomy  1949  . Eye surgery  2007    right eye; detached retina  . Cornea surgery  2009    right eye  . Hammer toe surgery  07/20/12    right  . Turbinate reduction  1989    History  Smoking status  . Never Smoker   Smokeless tobacco  . Never Used    History  Alcohol Use  . 0.0 oz/week    Comment: 1 glass a night     Family History  Problem Relation Age of Onset  . Colon cancer Neg Hx   . Stomach cancer Neg Hx     Reviw of Systems:  Reviewed in the HPI.  All other systems are negative.  Physical Exam: BP 128/62  Pulse 55  Ht 6\' 2"  (1.88 m)  Wt 200 lb (90.719 kg)  BMI 25.67 kg/m2 The patient is alert and oriented x 3.  The mood and affect are normal.   Skin: warm and dry.  Color is normal.    HEENT:   Normocephalic, atraumatic. He has no JVD. There is no bruits.  Lungs: His lung exam is clear.   Heart: Irregularly irregular.  His PMI is nondisplaced.    Abdomen:  His abdomen is nontender.  Extremities:  No edema.  Neuro:  Nonfocal, his gait is normal.    ECG: October 07, 2013:  Atrial fib at rate of 55.   Assessment / Plan:

## 2013-10-10 ENCOUNTER — Other Ambulatory Visit: Payer: Self-pay | Admitting: *Deleted

## 2013-10-10 DIAGNOSIS — I10 Essential (primary) hypertension: Secondary | ICD-10-CM

## 2013-10-10 MED ORDER — LISINOPRIL 5 MG PO TABS: 5.0000 mg | ORAL_TABLET | Freq: Every day | ORAL | Status: DC

## 2013-10-16 ENCOUNTER — Other Ambulatory Visit: Payer: Self-pay | Admitting: Cardiovascular Disease

## 2013-11-04 ENCOUNTER — Ambulatory Visit (INDEPENDENT_AMBULATORY_CARE_PROVIDER_SITE_OTHER): Payer: Medicare Other

## 2013-11-04 DIAGNOSIS — Z5181 Encounter for therapeutic drug level monitoring: Secondary | ICD-10-CM

## 2013-11-04 DIAGNOSIS — I4891 Unspecified atrial fibrillation: Secondary | ICD-10-CM | POA: Diagnosis not present

## 2013-11-04 LAB — POCT INR: INR: 2.6

## 2013-11-22 ENCOUNTER — Other Ambulatory Visit: Payer: Self-pay | Admitting: *Deleted

## 2013-11-22 MED ORDER — METOPROLOL TARTRATE 25 MG PO TABS: 12.5000 mg | ORAL_TABLET | Freq: Two times a day (BID) | ORAL | Status: DC

## 2013-12-16 ENCOUNTER — Ambulatory Visit (INDEPENDENT_AMBULATORY_CARE_PROVIDER_SITE_OTHER): Payer: Medicare Other

## 2013-12-16 DIAGNOSIS — Z5181 Encounter for therapeutic drug level monitoring: Secondary | ICD-10-CM

## 2013-12-16 DIAGNOSIS — I4891 Unspecified atrial fibrillation: Secondary | ICD-10-CM | POA: Diagnosis not present

## 2013-12-16 LAB — POCT INR: INR: 2.3

## 2014-01-03 ENCOUNTER — Other Ambulatory Visit: Payer: Self-pay | Admitting: Cardiovascular Disease

## 2014-01-27 ENCOUNTER — Ambulatory Visit (INDEPENDENT_AMBULATORY_CARE_PROVIDER_SITE_OTHER): Payer: Medicare Other | Admitting: Pharmacist

## 2014-01-27 DIAGNOSIS — I4891 Unspecified atrial fibrillation: Secondary | ICD-10-CM

## 2014-01-27 DIAGNOSIS — Z5181 Encounter for therapeutic drug level monitoring: Secondary | ICD-10-CM | POA: Diagnosis not present

## 2014-01-27 LAB — POCT INR: INR: 2.4

## 2014-02-08 ENCOUNTER — Other Ambulatory Visit: Payer: Self-pay

## 2014-02-08 MED ORDER — LISINOPRIL 5 MG PO TABS: 5.0000 mg | ORAL_TABLET | Freq: Every day | ORAL | Status: DC

## 2014-02-08 MED ORDER — FUROSEMIDE 40 MG PO TABS: 40.0000 mg | ORAL_TABLET | ORAL | Status: DC

## 2014-03-10 ENCOUNTER — Ambulatory Visit (INDEPENDENT_AMBULATORY_CARE_PROVIDER_SITE_OTHER): Payer: Medicare Other | Admitting: Pharmacist

## 2014-03-10 DIAGNOSIS — I4891 Unspecified atrial fibrillation: Secondary | ICD-10-CM

## 2014-03-10 DIAGNOSIS — Z5181 Encounter for therapeutic drug level monitoring: Secondary | ICD-10-CM

## 2014-03-10 LAB — POCT INR: INR: 2.2

## 2014-03-30 ENCOUNTER — Other Ambulatory Visit: Payer: Self-pay | Admitting: Cardiovascular Disease

## 2014-04-25 ENCOUNTER — Ambulatory Visit (INDEPENDENT_AMBULATORY_CARE_PROVIDER_SITE_OTHER): Payer: Medicare Other | Admitting: Pharmacist

## 2014-04-25 DIAGNOSIS — Z5181 Encounter for therapeutic drug level monitoring: Secondary | ICD-10-CM

## 2014-04-25 DIAGNOSIS — I4891 Unspecified atrial fibrillation: Secondary | ICD-10-CM

## 2014-04-25 LAB — POCT INR: INR: 2.5

## 2014-05-08 DIAGNOSIS — Z23 Encounter for immunization: Secondary | ICD-10-CM | POA: Diagnosis not present

## 2014-05-15 ENCOUNTER — Other Ambulatory Visit: Payer: Self-pay | Admitting: Cardiovascular Disease

## 2014-05-15 DIAGNOSIS — M545 Low back pain: Secondary | ICD-10-CM | POA: Diagnosis not present

## 2014-06-04 ENCOUNTER — Other Ambulatory Visit: Payer: Self-pay | Admitting: Cardiovascular Disease

## 2014-06-06 ENCOUNTER — Ambulatory Visit (INDEPENDENT_AMBULATORY_CARE_PROVIDER_SITE_OTHER): Payer: Medicare Other | Admitting: Pharmacist

## 2014-06-06 DIAGNOSIS — I4891 Unspecified atrial fibrillation: Secondary | ICD-10-CM | POA: Diagnosis not present

## 2014-06-06 DIAGNOSIS — Z5181 Encounter for therapeutic drug level monitoring: Secondary | ICD-10-CM | POA: Diagnosis not present

## 2014-06-06 LAB — POCT INR: INR: 2.8

## 2014-07-18 ENCOUNTER — Ambulatory Visit (INDEPENDENT_AMBULATORY_CARE_PROVIDER_SITE_OTHER): Payer: Medicare Other | Admitting: Pharmacist

## 2014-07-18 DIAGNOSIS — Z5181 Encounter for therapeutic drug level monitoring: Secondary | ICD-10-CM | POA: Diagnosis not present

## 2014-07-18 DIAGNOSIS — I4891 Unspecified atrial fibrillation: Secondary | ICD-10-CM

## 2014-07-18 LAB — POCT INR: INR: 2.1

## 2014-08-28 ENCOUNTER — Ambulatory Visit (INDEPENDENT_AMBULATORY_CARE_PROVIDER_SITE_OTHER): Payer: Medicare Other | Admitting: Ophthalmology

## 2014-08-29 ENCOUNTER — Ambulatory Visit (INDEPENDENT_AMBULATORY_CARE_PROVIDER_SITE_OTHER): Payer: Medicare Other | Admitting: *Deleted

## 2014-08-29 DIAGNOSIS — I4891 Unspecified atrial fibrillation: Secondary | ICD-10-CM | POA: Diagnosis not present

## 2014-08-29 DIAGNOSIS — Z5181 Encounter for therapeutic drug level monitoring: Secondary | ICD-10-CM | POA: Diagnosis not present

## 2014-08-29 LAB — POCT INR: INR: 2

## 2014-09-01 ENCOUNTER — Ambulatory Visit (INDEPENDENT_AMBULATORY_CARE_PROVIDER_SITE_OTHER): Payer: Medicare Other | Admitting: Ophthalmology

## 2014-09-01 DIAGNOSIS — I10 Essential (primary) hypertension: Secondary | ICD-10-CM

## 2014-09-01 DIAGNOSIS — E11329 Type 2 diabetes mellitus with mild nonproliferative diabetic retinopathy without macular edema: Secondary | ICD-10-CM

## 2014-09-01 DIAGNOSIS — H3531 Nonexudative age-related macular degeneration: Secondary | ICD-10-CM

## 2014-09-01 DIAGNOSIS — H35033 Hypertensive retinopathy, bilateral: Secondary | ICD-10-CM

## 2014-09-01 DIAGNOSIS — E11319 Type 2 diabetes mellitus with unspecified diabetic retinopathy without macular edema: Secondary | ICD-10-CM

## 2014-09-01 DIAGNOSIS — H43813 Vitreous degeneration, bilateral: Secondary | ICD-10-CM

## 2014-09-01 DIAGNOSIS — H2512 Age-related nuclear cataract, left eye: Secondary | ICD-10-CM | POA: Diagnosis not present

## 2014-09-08 ENCOUNTER — Other Ambulatory Visit: Payer: Self-pay | Admitting: Cardiovascular Disease

## 2014-09-19 ENCOUNTER — Other Ambulatory Visit: Payer: Self-pay | Admitting: Cardiovascular Disease

## 2014-10-16 ENCOUNTER — Ambulatory Visit (INDEPENDENT_AMBULATORY_CARE_PROVIDER_SITE_OTHER): Payer: Medicare Other | Admitting: Cardiovascular Disease

## 2014-10-16 ENCOUNTER — Encounter: Payer: Self-pay | Admitting: Cardiovascular Disease

## 2014-10-16 ENCOUNTER — Ambulatory Visit (INDEPENDENT_AMBULATORY_CARE_PROVIDER_SITE_OTHER): Payer: Medicare Other | Admitting: *Deleted

## 2014-10-16 VITALS — BP 126/70 | HR 73 | Ht 74.0 in | Wt 199.8 lb

## 2014-10-16 DIAGNOSIS — I4891 Unspecified atrial fibrillation: Secondary | ICD-10-CM | POA: Diagnosis not present

## 2014-10-16 DIAGNOSIS — Z5181 Encounter for therapeutic drug level monitoring: Secondary | ICD-10-CM

## 2014-10-16 LAB — POCT INR: INR: 2.2

## 2014-10-16 MED ORDER — APIXABAN 5 MG PO TABS: 5.0000 mg | ORAL_TABLET | Freq: Two times a day (BID) | ORAL | Status: DC

## 2014-10-16 NOTE — Progress Notes (Signed)
Cardiology Office Note   Date:  10/16/2014   ID:  Craig Simon, DOB [birth year], MRN [id number]  PCP:  Donnajean Lopes, MD  Cardiologist:   Thayer Headings, MD   Chief Complaint  Patient presents with  . Atrial Fibrillation   1. Atrial Fibrillation 2. Hypothyroidism 3. Diabetes mellitus 4. Hyperlipidemia 5. Obstructive sleep apnea- wears CPAP at night  History of Present Illness:  Craig Simon is a 74 year old gentleman with a history of atrial fibrillation. He's done quite well from a cardiac standpoint. He's not had any episodes of chest pain or shortness of breath. He's been able to do all of his normal activities without any problems.  He has noticed some leg edema. He has not been eating much salt.  October 07, 2013:  Craig Simon is doing well. He is able to do all of his normal activities without any significant problems. He denies any chest pain or shortness breath.   October 16, 2014:  Craig Simon is a 74 y.o. male who presents for f/u of his atrial fib   No CP , no dyspnea. Still very active..    Past Medical History  Diagnosis Date  . Chronic atrial fibrillation   . Chronic anticoagulation   . Diabetes mellitus   . Mitral regurgitation   . Hyperlipidemia   . CHF (congestive heart failure)   . Chest pain   . Leg edema   . Sleep apnea     has cpap but cannot tolerate  . Internal hemorrhoids without mention of complication [id number]/75/[id number]    Past Surgical History  Procedure Laterality Date  . Cardiac catheterization  11/03/2000    EF 40%  . US echocardiography  09/10/2004    EF 55-60%  . Doppler echocardiography  03/09/2001    EF 50%. MODERATE LVH  . Cardiovascular stress test  09/20/2004    EF 56%. NO EVIDENCE OF ISCHEMIA  . Adenoidectomy  1949  . Eye surgery  2007    right eye; detached retina  . Cornea surgery  2009    right eye  . Hammer toe surgery  07/20/12    right  . Turbinate reduction  1989     Current Outpatient Prescriptions  Medication Sig  Dispense Refill  . ezetimibe (ZETIA) 10 MG tablet Take 10 mg by mouth daily.      . fish oil-omega-3 fatty acids 1000 MG capsule Take 2 g by mouth 2 (two) times daily.      . fluticasone (FLONASE) 50 MCG/ACT nasal spray Place 2 sprays into the nose as needed for rhinitis. 1 g 12  . furosemide (LASIX) 40 MG tablet Take 1 tablet (40 mg total) by mouth 3 (three) times a week. 36 tablet 10  . KLOR-CON M20 20 MEQ tablet TAKE 6 TABLETS BY MOUTH EVERY WEEK 24 tablet 5  . levothyroxine (SYNTHROID, LEVOTHROID) 150 MCG tablet Take 150 mcg by mouth daily.      [name] lisinopril (PRINIVIL,ZESTRIL) 5 MG tablet Take 1 tablet (5 mg total) by mouth daily. 30 tablet 10  . metFORMIN (GLUCOPHAGE) 500 MG tablet Take 500 mg by mouth at bedtime.      . metoprolol tartrate (LOPRESSOR) 25 MG tablet TAKE 1/2 TABLET BY MOUTH TWICE DAILY 30 tablet 10  . Multiple Vitamin (MULTIVITAMIN) tablet Take 1 tablet by mouth daily.      . multivitamin-lutein (OCUVITE-LUTEIN) CAPS Take 1 capsule by mouth daily.      . pioglitazone (ACTOS) 15 MG tablet Take 15 mg by mouth  daily.      . rosuvastatin (CRESTOR) 20 MG tablet Take 20 mg by mouth daily.      . SF 1.1 % GEL dental gel     . warfarin (COUMADIN) 5 MG tablet Take as directed by coumadin clinic 30 tablet 3   No current facility-administered medications for this visit.    Allergies:   Review of patient's allergies indicates no known allergies.    Social History:  The patient  reports that he has never smoked. He has never used smokeless tobacco. He reports that he drinks alcohol. He reports that he does not use illicit drugs.   Family History:  The patient's family history is negative for Colon cancer and Stomach cancer.    ROS:  Please see the history of present illness.    Review of Systems: Constitutional:  denies fever, chills, diaphoresis, appetite change and fatigue.  HEENT: denies photophobia, eye pain, redness, hearing loss, ear pain, congestion, sore throat,  rhinorrhea, sneezing, neck pain, neck stiffness and tinnitus.  Respiratory: denies SOB, DOE, cough, chest tightness, and wheezing.  Cardiovascular: denies chest pain, palpitations and leg swelling.  Gastrointestinal: denies nausea, vomiting, abdominal pain, diarrhea, constipation, blood in stool.  Genitourinary: denies dysuria, urgency, frequency, hematuria, flank pain and difficulty urinating.  Musculoskeletal: denies  myalgias, back pain, joint swelling, arthralgias and gait problem.   Skin: denies pallor, rash and wound.  Neurological: denies dizziness, seizures, syncope, weakness, light-headedness, numbness and headaches.   Hematological: denies adenopathy, easy bruising, personal or family bleeding history.  Psychiatric/ Behavioral: denies suicidal ideation, mood changes, confusion, nervousness, sleep disturbance and agitation.       All other systems are reviewed and negative.    PHYSICAL EXAM: VS:  BP 126/70 mmHg  Pulse 73  Ht 6\' 2"  (1.88 m)  Wt 199 lb 12.8 oz (90.629 kg)  BMI 25.64 kg/m2 , BMI Body mass index is 25.64 kg/(m^2). GEN: Well nourished, well developed, in no acute distress HEENT: normal Neck: no JVD, carotid bruits, or masses Cardiac: IRReg Irreg. ; no murmurs, rubs, or gallops,no edema  Respiratory:  clear to auscultation bilaterally, normal work of breathing GI: soft, nontender, nondistended, + BS MS: no deformity or atrophy Skin: warm and dry, no rash Neuro:  Strength and sensation are intact Psych: normal   EKG:  EKG is ordered today. The ekg ordered today demonstrates atrial fib at 73.  No ST or T wave changes.    Recent Labs: No results found for requested labs within last 365 days.    Lipid Panel    Component Value Date/Time   CHOL 101 05/07/2011 1117   TRIG 119.0 05/07/2011 1117   HDL 48.10 05/07/2011 1117   CHOLHDL 2 05/07/2011 1117   VLDL 23.8 05/07/2011 1117   LDLCALC 29 05/07/2011 1117      Wt Readings from Last 3 Encounters:    10/16/14 199 lb 12.8 oz (90.629 kg)  10/07/13 200 lb (90.719 kg)  08/09/12 198 lb (89.812 kg)      Other studies Reviewed: Additional studies/ records that were reviewed today include: INR levesl . Review of the above records demonstrates: he has been theraputic  For his INR    ASSESSMENT AND PLAN:  1.  Atrial fibrillation: The patient is doing well from an A. fib standpoint. He wants to change to Eliquis.  Will hold his coumadin today and tomorrow and start Eliquis 5 BID on Wednesday .  We will check a CBC and a basic metabolic profile  today. Will recheck a CBC in one month.  It's been over 10 years since her last echocardiogram. We'll get another echocardiogram to assess his left ventricle systolic function since he has chronic atrial fibrillation. I'll see him again in one year.  Current medicines are reviewed at length with the patient today.  The patient does not have concerns regarding medicines.  The following changes have been made:  See above   Labs/ tests ordered today include:  No orders of the defined types were placed in this encounter.     Disposition:   FU with me in 1 year    Signed, Nahser, Wonda Cheng, MD  10/16/2014 4:04 PM    [facility] [place], [place], [place]  [address] Phone: [phone]; Fax: [phone]

## 2014-10-16 NOTE — Patient Instructions (Addendum)
Medication Instructions:  STOP Coumadin START Eliquis 5 mg twice daily - START on Wednesday April 13 in the morning and take every 12 hours  Labwork: Your physician recommends that you have lab work today:  BMET, CBC return for lab work in: 1 month with Coumadin Clinic visit BMET, CBC   Testing/Procedures: Your physician has requested that you have an echocardiogram. Echocardiography is a painless test that uses sound waves to create images of your heart. It provides your doctor with information about the size and shape of your heart and how well your heart's chambers and valves are working. This procedure takes approximately one hour. There are no restrictions for this procedure.   Follow-Up: Your physician wants you to follow-up in: 1 year with Dr. Acie Fredrickson.  You will receive a reminder letter in the mail two months in advance. If you don't receive a letter, please call our office to schedule the follow-up appointment.   Any Other Special Instructions Will Be Listed Below (If Applicable). We will schedule you to be seen in Coumadin Clinic in 1 month to monitor your transition from Coumadin to Eliquis

## 2014-10-17 LAB — CBC WITH DIFFERENTIAL/PLATELET
BASOS PCT: 0.4 % (ref 0.0–3.0)
Basophils Absolute: 0 10*3/uL (ref 0.0–0.1)
Eosinophils Absolute: 0.1 10*3/uL (ref 0.0–0.7)
Eosinophils Relative: 1.2 % (ref 0.0–5.0)
HEMATOCRIT: 40.6 % (ref 39.0–52.0)
HEMOGLOBIN: 13.8 g/dL (ref 13.0–17.0)
LYMPHS ABS: 1.2 10*3/uL (ref 0.7–4.0)
LYMPHS PCT: 16.9 % (ref 12.0–46.0)
MCHC: 33.9 g/dL (ref 30.0–36.0)
MCV: 87.3 fl (ref 78.0–100.0)
MONOS PCT: 6.4 % (ref 3.0–12.0)
Monocytes Absolute: 0.5 10*3/uL (ref 0.1–1.0)
Neutro Abs: 5.3 10*3/uL (ref 1.4–7.7)
Neutrophils Relative %: 75.1 % (ref 43.0–77.0)
Platelets: 121 10*3/uL — ABNORMAL LOW (ref 150.0–400.0)
RBC: 4.65 Mil/uL (ref 4.22–5.81)
RDW: 13.9 % (ref 11.5–15.5)
WBC: 7.1 10*3/uL (ref 4.0–10.5)

## 2014-10-17 LAB — BASIC METABOLIC PANEL
BUN: 19 mg/dL (ref 6–23)
CALCIUM: 9 mg/dL (ref 8.4–10.5)
CO2: 29 meq/L (ref 19–32)
CREATININE: 0.9 mg/dL (ref 0.40–1.50)
Chloride: 102 mEq/L (ref 96–112)
GFR: 87.97 mL/min (ref >60.00)
Glucose, Bld: 114 mg/dL — ABNORMAL HIGH (ref 70–99)
Potassium: 4.2 mEq/L (ref 3.5–5.1)
SODIUM: 137 meq/L (ref 135–145)

## 2014-10-19 ENCOUNTER — Ambulatory Visit (HOSPITAL_COMMUNITY): Payer: Medicare Other | Attending: Cardiology

## 2014-10-19 DIAGNOSIS — I4891 Unspecified atrial fibrillation: Secondary | ICD-10-CM

## 2014-10-19 NOTE — Progress Notes (Signed)
2D Echo completed. 10/19/2014 

## 2014-10-31 DIAGNOSIS — E039 Hypothyroidism, unspecified: Secondary | ICD-10-CM | POA: Diagnosis not present

## 2014-10-31 DIAGNOSIS — E1151 Type 2 diabetes mellitus with diabetic peripheral angiopathy without gangrene: Secondary | ICD-10-CM | POA: Diagnosis not present

## 2014-10-31 DIAGNOSIS — Z1389 Encounter for screening for other disorder: Secondary | ICD-10-CM | POA: Diagnosis not present

## 2014-10-31 DIAGNOSIS — Z6826 Body mass index (BMI) 26.0-26.9, adult: Secondary | ICD-10-CM | POA: Diagnosis not present

## 2014-10-31 DIAGNOSIS — I739 Peripheral vascular disease, unspecified: Secondary | ICD-10-CM | POA: Diagnosis not present

## 2014-10-31 DIAGNOSIS — E785 Hyperlipidemia, unspecified: Secondary | ICD-10-CM | POA: Diagnosis not present

## 2014-10-31 DIAGNOSIS — Z23 Encounter for immunization: Secondary | ICD-10-CM | POA: Diagnosis not present

## 2014-10-31 DIAGNOSIS — I482 Chronic atrial fibrillation: Secondary | ICD-10-CM | POA: Diagnosis not present

## 2014-11-03 ENCOUNTER — Encounter: Payer: Self-pay | Admitting: Gastroenterology

## 2014-11-13 ENCOUNTER — Other Ambulatory Visit (INDEPENDENT_AMBULATORY_CARE_PROVIDER_SITE_OTHER): Payer: Medicare Other

## 2014-11-13 ENCOUNTER — Ambulatory Visit (INDEPENDENT_AMBULATORY_CARE_PROVIDER_SITE_OTHER): Payer: Medicare Other | Admitting: *Deleted

## 2014-11-13 DIAGNOSIS — I4891 Unspecified atrial fibrillation: Secondary | ICD-10-CM | POA: Diagnosis not present

## 2014-11-13 LAB — CBC WITH DIFFERENTIAL/PLATELET
Basophils Absolute: 0 10*3/uL (ref 0.0–0.1)
Basophils Relative: 0.3 % (ref 0.0–3.0)
EOS PCT: 0.7 % (ref 0.0–5.0)
Eosinophils Absolute: 0 10*3/uL (ref 0.0–0.7)
HCT: 39.4 % (ref 39.0–52.0)
Hemoglobin: 13.1 g/dL (ref 13.0–17.0)
Lymphocytes Relative: 21 % (ref 12.0–46.0)
Lymphs Abs: 1.3 10*3/uL (ref 0.7–4.0)
MCHC: 33.3 g/dL (ref 30.0–36.0)
MCV: 88.5 fl (ref 78.0–100.0)
MONO ABS: 0.8 10*3/uL (ref 0.1–1.0)
Monocytes Relative: 12 % (ref 3.0–12.0)
NEUTROS PCT: 66 % (ref 43.0–77.0)
Neutro Abs: 4.2 10*3/uL (ref 1.4–7.7)
PLATELETS: 106 10*3/uL — AB (ref 150.0–400.0)
RBC: 4.45 Mil/uL (ref 4.22–5.81)
RDW: 13.8 % (ref 11.5–15.5)
WBC: 6.3 10*3/uL (ref 4.0–10.5)

## 2014-11-13 LAB — BASIC METABOLIC PANEL
BUN: 17 mg/dL (ref 6–23)
CHLORIDE: 101 meq/L (ref 96–112)
CO2: 33 meq/L — AB (ref 19–32)
Calcium: 8.9 mg/dL (ref 8.4–10.5)
Creatinine, Ser: 0.91 mg/dL (ref 0.40–1.50)
GFR: 86.84 mL/min (ref >60.00)
Glucose, Bld: 118 mg/dL — ABNORMAL HIGH (ref 70–99)
Potassium: 3.8 mEq/L (ref 3.5–5.1)
SODIUM: 136 meq/L (ref 135–145)

## 2014-11-13 NOTE — Progress Notes (Signed)
Pt was started on Eliquis 5mg  twice a day for Afib on October 18, 2014 by Dr. Acie Fredrickson.     Reviewed patients medication list.  Pt is not currently on any combined P-gp and strong CYP3A4 inhibitors/inducers (ketoconazole, traconazole, ritonavir, carbamazepine, phenytoin, rifampin, St. John's wort).  Reviewed labs:  SCr 0.90, Hgb 13.1, HCT 39.4, Weight  91.7 kg, Dose appropriate based on dosing criteria.    A full discussion of the nature of anticoagulants has been carried out.  A benefit/risk analysis has been presented to the patient, so that they understand the justification for choosing anticoagulation with Eliquis at this time.  The need for compliance is stressed.  Pt is aware to take the medication twice daily.  Side effects of potential bleeding are discussed, including unusual colored urine or stools, coughing up blood or coffee ground emesis, nose bleeds or serious fall or head trauma.  Discussed signs and symptoms of stroke. The patient should avoid any OTC items containing aspirin or ibuprofen.  Avoid alcohol consumption.   Call if any signs of abnormal bleeding.  Discussed financial obligations and resolved any difficulty in obtaining medication.  Next lab test test in 6 months.

## 2014-11-14 NOTE — Progress Notes (Signed)
Reviewed labs with patient. Now on Eliquis.  Dose is correct at 5 mg bid.

## 2014-11-29 ENCOUNTER — Telehealth: Payer: Self-pay

## 2014-11-29 ENCOUNTER — Encounter: Payer: Self-pay | Admitting: Gastroenterology

## 2014-11-29 ENCOUNTER — Ambulatory Visit (INDEPENDENT_AMBULATORY_CARE_PROVIDER_SITE_OTHER): Payer: Medicare Other | Admitting: Gastroenterology

## 2014-11-29 VITALS — BP 120/70 | HR 64 | Ht 74.0 in | Wt 198.6 lb

## 2014-11-29 DIAGNOSIS — I482 Chronic atrial fibrillation, unspecified: Secondary | ICD-10-CM

## 2014-11-29 DIAGNOSIS — Z7901 Long term (current) use of anticoagulants: Secondary | ICD-10-CM

## 2014-11-29 DIAGNOSIS — Z1211 Encounter for screening for malignant neoplasm of colon: Secondary | ICD-10-CM

## 2014-11-29 NOTE — Telephone Encounter (Signed)
He may hold Eliquis for 2 days  Prior to endoscopy   Mertie Moores, MD

## 2014-11-29 NOTE — Telephone Encounter (Signed)
  11/29/2014   RE: Mykel Sponaugle DOB: 26-May-1941 MRN: [id number]   Dear Dr. Acie Fredrickson,    We have scheduled the above patient for an endoscopic procedure. Our records show that he is on anticoagulation therapy.   Please advise as to how long the patient may come off his therapy of Eliquis prior to the procedure, which is scheduled for 01/03/15.   Sincerely,    Marlon Pel, CMA

## 2014-11-29 NOTE — Patient Instructions (Signed)
You have been scheduled for a colonoscopy. Please follow written instructions given to you at your visit today.  Please pick up your prep supplies at the pharmacy within the next 1-3 days. If you use inhalers (even only as needed), please bring them with you on the day of your procedure. Your physician has requested that you go to www.startemmi.com and enter the access code given to you at your visit today. This web site gives a general overview about your procedure. However, you should still follow specific instructions given to you by our office regarding your preparation for the procedure.  Thank you for choosing me and [facility].  Pricilla Riffle. Dagoberto Ligas., MD., Marval Regal  cc: Leanna Battles, MD
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
## 2014-11-30 NOTE — Telephone Encounter (Signed)
Informed patient to hold Eliquis 2 days before his procedure per Dr. Acie Fredrickson recommendations. Pt verbalized understanding.

## 2014-12-07 ENCOUNTER — Encounter (INDEPENDENT_AMBULATORY_CARE_PROVIDER_SITE_OTHER): Payer: Medicare Other | Admitting: Ophthalmology

## 2014-12-07 DIAGNOSIS — H3531 Nonexudative age-related macular degeneration: Secondary | ICD-10-CM | POA: Diagnosis not present

## 2014-12-07 DIAGNOSIS — H3532 Exudative age-related macular degeneration: Secondary | ICD-10-CM

## 2014-12-07 DIAGNOSIS — H35033 Hypertensive retinopathy, bilateral: Secondary | ICD-10-CM

## 2014-12-07 DIAGNOSIS — H43813 Vitreous degeneration, bilateral: Secondary | ICD-10-CM

## 2014-12-07 DIAGNOSIS — I10 Essential (primary) hypertension: Secondary | ICD-10-CM

## 2014-12-24 ENCOUNTER — Other Ambulatory Visit: Payer: Self-pay | Admitting: Cardiovascular Disease

## 2014-12-25 ENCOUNTER — Other Ambulatory Visit: Payer: Self-pay

## 2014-12-25 MED ORDER — LISINOPRIL 5 MG PO TABS: 5.0000 mg | ORAL_TABLET | Freq: Every day | ORAL | Status: DC

## 2015-01-03 ENCOUNTER — Encounter: Payer: Self-pay | Admitting: Gastroenterology

## 2015-01-03 ENCOUNTER — Ambulatory Visit (AMBULATORY_SURGERY_CENTER): Payer: Medicare Other | Admitting: Gastroenterology

## 2015-01-03 VITALS — BP 114/65 | HR 71 | Temp 97.0°F | Resp 29 | Ht 74.0 in | Wt 198.0 lb

## 2015-01-03 DIAGNOSIS — D125 Benign neoplasm of sigmoid colon: Secondary | ICD-10-CM

## 2015-01-03 DIAGNOSIS — I4891 Unspecified atrial fibrillation: Secondary | ICD-10-CM | POA: Diagnosis not present

## 2015-01-03 DIAGNOSIS — Z1211 Encounter for screening for malignant neoplasm of colon: Secondary | ICD-10-CM | POA: Diagnosis not present

## 2015-01-03 DIAGNOSIS — G4733 Obstructive sleep apnea (adult) (pediatric): Secondary | ICD-10-CM | POA: Diagnosis not present

## 2015-01-03 DIAGNOSIS — E039 Hypothyroidism, unspecified: Secondary | ICD-10-CM | POA: Diagnosis not present

## 2015-01-03 LAB — GLUCOSE, CAPILLARY
Glucose-Capillary: 134 mg/dL — ABNORMAL HIGH (ref 65–99)
Glucose-Capillary: 120 mg/dL — ABNORMAL HIGH (ref 65–99)

## 2015-01-03 MED ORDER — SODIUM CHLORIDE 0.9 % IV SOLN: 500.0000 mL | INTRAVENOUS | Status: DC

## 2015-01-03 NOTE — Progress Notes (Signed)
Called to room to assist during endoscopic procedure.  Patient ID and intended procedure confirmed with present staff. Received instructions for my participation in the procedure from the performing physician.  

## 2015-01-03 NOTE — Patient Instructions (Signed)
YOU HAD AN ENDOSCOPIC PROCEDURE TODAY AT [facility]:   Refer to the procedure report that was given to you for any specific questions about what was found during the examination.  If the procedure report does not answer your questions, please call your gastroenterologist to clarify.  If you requested that your care partner not be given the details of your procedure findings, then the procedure report has been included in a sealed envelope for you to review at your convenience later.  YOU SHOULD EXPECT: Some feelings of bloating in the abdomen. Passage of more gas than usual.  Walking can help get rid of the air that was put into your GI tract during the procedure and reduce the bloating. If you had a lower endoscopy (such as a colonoscopy or flexible sigmoidoscopy) you may notice spotting of blood in your stool or on the toilet paper. If you underwent a bowel prep for your procedure, you may not have a normal bowel movement for a few days.  Please Note:  You might notice some irritation and congestion in your nose or some drainage.  This is from the oxygen used during your procedure.  There is no need for concern and it should clear up in a day or so.  SYMPTOMS TO REPORT IMMEDIATELY:   Following lower endoscopy (colonoscopy or flexible sigmoidoscopy):  Excessive amounts of blood in the stool  Significant tenderness or worsening of abdominal pains  Swelling of the abdomen that is new, acute  Fever of 100F or higher  For urgent or emergent issues, a gastroenterologist can be reached at any hour by calling [phone].   DIET: Your first meal following the procedure should be a small meal and then it is ok to progress to your normal diet. Heavy or fried foods are harder to digest and may make you feel nauseous or bloated.  Likewise, meals heavy in dairy and vegetables can increase bloating.  Drink plenty of fluids but you should avoid alcoholic beverages for 24  hours.  ACTIVITY:  You should plan to take it easy for the rest of today and you should NOT DRIVE or use heavy machinery until tomorrow (because of the sedation medicines used during the test).    FOLLOW UP: Our staff will call the number listed on your records the next business day following your procedure to check on you and address any questions or concerns that you may have regarding the information given to you following your procedure. If we do not reach you, we will leave a message.  However, if you are feeling well and you are not experiencing any problems, there is no need to return our call.  We will assume that you have returned to your regular daily activities without incident.  If any biopsies were taken you will be contacted by phone or by letter within the next 1-3 weeks.  Please call us at [phone] if you have not heard about the biopsies in 3 weeks.    SIGNATURES/CONFIDENTIALITY: You and/or your care partner have signed paperwork which will be entered into your electronic medical record.  These signatures attest to the fact that that the information above on your After Visit Summary has been reviewed and is understood.  Full responsibility of the confidentiality of this discharge information lies with you and/or your care-partner.    Handouts were given to your care partner on polyps, hemorrhoids, and a high fiber diet with liberal fluid intake. Restart you ELIQUIS in  2 days.  Hold aspirin and all other NSAIDS for 2 weeks.  You may resume your other current medications today. Await biopsy results. Your blood sugar was 120 in the recovery room. Please call if any questions or concerns.

## 2015-01-03 NOTE — Progress Notes (Signed)
Report to PACU, RN, vss, BBS= Clear.  

## 2015-01-03 NOTE — Progress Notes (Signed)
No problems noted in the recovery room. Maw   Held extra pressure to iv site when cather was removed. maw

## 2015-01-03 NOTE — Op Note (Signed)
[facility]  [address] [place], [address]   COLONOSCOPY PROCEDURE REPORT  PATIENT: Simon, Craig  MR#: [id number] BIRTHDATE: May 25, 1941 , 72  yrs. old GENDER: male ENDOSCOPIST: Ladene Artist, MD, [facility] REFERRED QM:GNOIBB Craig Simon, M.D. PROCEDURE DATE:  01/03/2015 PROCEDURE:   Colonoscopy, screening and Colonoscopy with snare polypectomy First Screening Colonoscopy - Avg.  risk and is 50 yrs.  old or older - No.  Prior Negative Screening - Now for repeat screening. 10 or more years since last screening  History of Adenoma - Now for follow-up colonoscopy & has been > or = to 3 yrs.  N/A  Polyps removed today? Yes ASA CLASS:   Class III INDICATIONS:Screening for colonic neoplasia and Colorectal Neoplasm Risk Assessment for this procedure is average risk. MEDICATIONS: Monitored anesthesia care, Propofol 200 mg IV, and lidocaine 150 mg IV DESCRIPTION OF PROCEDURE:   After the risks benefits and alternatives of the procedure were thoroughly explained, informed consent was obtained.  The digital rectal exam revealed no abnormalities of the rectum.   The LB [id number]  endoscope was introduced through the anus and advanced to the cecum, which was identified by both the appendix and ileocecal valve. No adverse events experienced.   The quality of the prep was good.  (Suprep was used)  The instrument was then slowly withdrawn as the colon was fully examined. Estimated blood loss is zero unless otherwise noted in this procedure report.    COLON FINDINGS: A sessile polyp measuring 5 mm in size was found in the sigmoid colon.  A polypectomy was performed with a cold snare. The resection was complete, the polyp tissue was completely retrieved and sent to histology.   The examination was otherwise normal.  Retroflexed views revealed internal Grade I hemorrhoids. The time to cecum = 3.8 Withdrawal time = 13.1   The scope was withdrawn and the procedure  completed. COMPLICATIONS: There were no immediate complications.  ENDOSCOPIC IMPRESSION: 1.   Sessile polyp in the sigmoid colon; polypectomy performed with a cold snare 2.   Grade l internal hemorrhoids  RECOMMENDATIONS: 1.  Hold Aspirin and all other NSAIDS for 2 weeks. 2.  Await pathology results 3.  Repeat colonoscopy in 5 years if polyp adenomatous; otherwise, given your age, you will not need another colonoscopy for colon cancer screening or polyp surveillance.  These types of tests usually stop around the age 40. 4.  Resume Eliquis in 2 days  eSigned:  Ladene Artist, MD, [facility] 01/03/2015 10:57 AM      PATIENT NAME:  Craig Simon, Craig Simon MR#: [id number]

## 2015-01-04 ENCOUNTER — Telehealth: Payer: Self-pay | Admitting: *Deleted

## 2015-01-04 ENCOUNTER — Encounter (INDEPENDENT_AMBULATORY_CARE_PROVIDER_SITE_OTHER): Payer: Medicare Other | Admitting: Ophthalmology

## 2015-01-04 DIAGNOSIS — H338 Other retinal detachments: Secondary | ICD-10-CM

## 2015-01-04 DIAGNOSIS — H35033 Hypertensive retinopathy, bilateral: Secondary | ICD-10-CM | POA: Diagnosis not present

## 2015-01-04 DIAGNOSIS — H43813 Vitreous degeneration, bilateral: Secondary | ICD-10-CM

## 2015-01-04 DIAGNOSIS — H3532 Exudative age-related macular degeneration: Secondary | ICD-10-CM | POA: Diagnosis not present

## 2015-01-04 DIAGNOSIS — H3531 Nonexudative age-related macular degeneration: Secondary | ICD-10-CM | POA: Diagnosis not present

## 2015-01-04 DIAGNOSIS — I10 Essential (primary) hypertension: Secondary | ICD-10-CM | POA: Diagnosis not present

## 2015-01-04 NOTE — Telephone Encounter (Signed)
  Follow up Call-  Call back number 01/03/2015  Post procedure Call Back phone  # cell [phone]  Permission to leave phone message Yes     Patient questions:  Do you have a fever, pain , or abdominal swelling? No. Pain Score  0 *  Have you tolerated food without any problems? Yes.    Have you been able to return to your normal activities? Yes.    Do you have any questions about your discharge instructions: Diet   No. Medications  No. Follow up visit  No.  Do you have questions or concerns about your Care? No.  Actions: * If pain score is 4 or above: No action needed, pain <4.

## 2015-01-14 ENCOUNTER — Encounter: Payer: Self-pay | Admitting: Gastroenterology

## 2015-02-01 ENCOUNTER — Encounter (INDEPENDENT_AMBULATORY_CARE_PROVIDER_SITE_OTHER): Payer: Medicare Other | Admitting: Ophthalmology

## 2015-02-01 DIAGNOSIS — H3531 Nonexudative age-related macular degeneration: Secondary | ICD-10-CM | POA: Diagnosis not present

## 2015-02-01 DIAGNOSIS — I10 Essential (primary) hypertension: Secondary | ICD-10-CM

## 2015-02-01 DIAGNOSIS — H35033 Hypertensive retinopathy, bilateral: Secondary | ICD-10-CM | POA: Diagnosis not present

## 2015-02-01 DIAGNOSIS — H338 Other retinal detachments: Secondary | ICD-10-CM

## 2015-02-01 DIAGNOSIS — H3532 Exudative age-related macular degeneration: Secondary | ICD-10-CM

## 2015-02-01 DIAGNOSIS — H43813 Vitreous degeneration, bilateral: Secondary | ICD-10-CM | POA: Diagnosis not present

## 2015-02-16 ENCOUNTER — Other Ambulatory Visit: Payer: Self-pay | Admitting: Cardiovascular Disease

## 2015-02-16 MED ORDER — POTASSIUM CHLORIDE CRYS ER 20 MEQ PO TBCR: 120.0000 meq | EXTENDED_RELEASE_TABLET | ORAL | Status: DC

## 2015-03-02 ENCOUNTER — Ambulatory Visit (INDEPENDENT_AMBULATORY_CARE_PROVIDER_SITE_OTHER): Payer: Medicare Other | Admitting: Ophthalmology

## 2015-03-05 ENCOUNTER — Encounter (INDEPENDENT_AMBULATORY_CARE_PROVIDER_SITE_OTHER): Payer: Medicare Other | Admitting: Ophthalmology

## 2015-03-05 ENCOUNTER — Other Ambulatory Visit: Payer: Self-pay | Admitting: Cardiovascular Disease

## 2015-03-05 DIAGNOSIS — H3531 Nonexudative age-related macular degeneration: Secondary | ICD-10-CM | POA: Diagnosis not present

## 2015-03-05 DIAGNOSIS — H338 Other retinal detachments: Secondary | ICD-10-CM | POA: Diagnosis not present

## 2015-03-05 DIAGNOSIS — H3523 Other non-diabetic proliferative retinopathy, bilateral: Secondary | ICD-10-CM

## 2015-03-05 DIAGNOSIS — H2512 Age-related nuclear cataract, left eye: Secondary | ICD-10-CM | POA: Diagnosis not present

## 2015-03-05 DIAGNOSIS — I10 Essential (primary) hypertension: Secondary | ICD-10-CM

## 2015-03-05 DIAGNOSIS — H43813 Vitreous degeneration, bilateral: Secondary | ICD-10-CM | POA: Diagnosis not present

## 2015-03-05 DIAGNOSIS — H35033 Hypertensive retinopathy, bilateral: Secondary | ICD-10-CM

## 2015-03-30 ENCOUNTER — Other Ambulatory Visit: Payer: Self-pay | Admitting: Cardiovascular Disease

## 2015-04-02 ENCOUNTER — Encounter (INDEPENDENT_AMBULATORY_CARE_PROVIDER_SITE_OTHER): Payer: Medicare Other | Admitting: Ophthalmology

## 2015-04-02 DIAGNOSIS — I10 Essential (primary) hypertension: Secondary | ICD-10-CM | POA: Diagnosis not present

## 2015-04-02 DIAGNOSIS — H35033 Hypertensive retinopathy, bilateral: Secondary | ICD-10-CM

## 2015-04-02 DIAGNOSIS — H3531 Nonexudative age-related macular degeneration: Secondary | ICD-10-CM

## 2015-04-02 DIAGNOSIS — H43813 Vitreous degeneration, bilateral: Secondary | ICD-10-CM

## 2015-04-02 DIAGNOSIS — H3532 Exudative age-related macular degeneration: Secondary | ICD-10-CM

## 2015-04-02 DIAGNOSIS — H338 Other retinal detachments: Secondary | ICD-10-CM | POA: Diagnosis not present

## 2015-04-07 DIAGNOSIS — Z23 Encounter for immunization: Secondary | ICD-10-CM | POA: Diagnosis not present

## 2015-04-17 ENCOUNTER — Other Ambulatory Visit: Payer: Self-pay | Admitting: Cardiovascular Disease

## 2015-04-30 ENCOUNTER — Encounter (INDEPENDENT_AMBULATORY_CARE_PROVIDER_SITE_OTHER): Payer: Medicare Other | Admitting: Ophthalmology

## 2015-04-30 DIAGNOSIS — H35033 Hypertensive retinopathy, bilateral: Secondary | ICD-10-CM

## 2015-04-30 DIAGNOSIS — H353124 Nonexudative age-related macular degeneration, left eye, advanced atrophic with subfoveal involvement: Secondary | ICD-10-CM | POA: Diagnosis not present

## 2015-04-30 DIAGNOSIS — H353211 Exudative age-related macular degeneration, right eye, with active choroidal neovascularization: Secondary | ICD-10-CM | POA: Diagnosis not present

## 2015-04-30 DIAGNOSIS — I10 Essential (primary) hypertension: Secondary | ICD-10-CM | POA: Diagnosis not present

## 2015-04-30 DIAGNOSIS — H43813 Vitreous degeneration, bilateral: Secondary | ICD-10-CM

## 2015-05-28 ENCOUNTER — Encounter (INDEPENDENT_AMBULATORY_CARE_PROVIDER_SITE_OTHER): Payer: Medicare Other | Admitting: Ophthalmology

## 2015-05-28 DIAGNOSIS — I10 Essential (primary) hypertension: Secondary | ICD-10-CM | POA: Diagnosis not present

## 2015-05-28 DIAGNOSIS — H43813 Vitreous degeneration, bilateral: Secondary | ICD-10-CM

## 2015-05-28 DIAGNOSIS — H2701 Aphakia, right eye: Secondary | ICD-10-CM | POA: Diagnosis not present

## 2015-05-28 DIAGNOSIS — H35033 Hypertensive retinopathy, bilateral: Secondary | ICD-10-CM

## 2015-05-28 DIAGNOSIS — H2512 Age-related nuclear cataract, left eye: Secondary | ICD-10-CM

## 2015-05-28 DIAGNOSIS — H353124 Nonexudative age-related macular degeneration, left eye, advanced atrophic with subfoveal involvement: Secondary | ICD-10-CM

## 2015-05-28 DIAGNOSIS — H353211 Exudative age-related macular degeneration, right eye, with active choroidal neovascularization: Secondary | ICD-10-CM

## 2015-06-25 ENCOUNTER — Encounter (INDEPENDENT_AMBULATORY_CARE_PROVIDER_SITE_OTHER): Payer: Medicare Other | Admitting: Ophthalmology

## 2015-06-25 DIAGNOSIS — H353124 Nonexudative age-related macular degeneration, left eye, advanced atrophic with subfoveal involvement: Secondary | ICD-10-CM

## 2015-06-25 DIAGNOSIS — H35033 Hypertensive retinopathy, bilateral: Secondary | ICD-10-CM

## 2015-06-25 DIAGNOSIS — H353211 Exudative age-related macular degeneration, right eye, with active choroidal neovascularization: Secondary | ICD-10-CM

## 2015-06-25 DIAGNOSIS — I10 Essential (primary) hypertension: Secondary | ICD-10-CM | POA: Diagnosis not present

## 2015-06-25 DIAGNOSIS — H43813 Vitreous degeneration, bilateral: Secondary | ICD-10-CM

## 2015-07-30 ENCOUNTER — Encounter (INDEPENDENT_AMBULATORY_CARE_PROVIDER_SITE_OTHER): Payer: Medicare Other | Admitting: Ophthalmology

## 2015-07-30 DIAGNOSIS — I10 Essential (primary) hypertension: Secondary | ICD-10-CM | POA: Diagnosis not present

## 2015-07-30 DIAGNOSIS — H353124 Nonexudative age-related macular degeneration, left eye, advanced atrophic with subfoveal involvement: Secondary | ICD-10-CM | POA: Diagnosis not present

## 2015-07-30 DIAGNOSIS — H43813 Vitreous degeneration, bilateral: Secondary | ICD-10-CM

## 2015-07-30 DIAGNOSIS — H35033 Hypertensive retinopathy, bilateral: Secondary | ICD-10-CM

## 2015-07-30 DIAGNOSIS — H353211 Exudative age-related macular degeneration, right eye, with active choroidal neovascularization: Secondary | ICD-10-CM | POA: Diagnosis not present

## 2015-07-30 DIAGNOSIS — H338 Other retinal detachments: Secondary | ICD-10-CM

## 2015-08-11 ENCOUNTER — Other Ambulatory Visit: Payer: Self-pay | Admitting: Cardiovascular Disease

## 2015-09-05 ENCOUNTER — Encounter (INDEPENDENT_AMBULATORY_CARE_PROVIDER_SITE_OTHER): Payer: Medicare Other | Admitting: Ophthalmology

## 2015-09-05 DIAGNOSIS — H43813 Vitreous degeneration, bilateral: Secondary | ICD-10-CM

## 2015-09-05 DIAGNOSIS — H353124 Nonexudative age-related macular degeneration, left eye, advanced atrophic with subfoveal involvement: Secondary | ICD-10-CM

## 2015-09-05 DIAGNOSIS — H35033 Hypertensive retinopathy, bilateral: Secondary | ICD-10-CM

## 2015-09-05 DIAGNOSIS — H353211 Exudative age-related macular degeneration, right eye, with active choroidal neovascularization: Secondary | ICD-10-CM

## 2015-09-05 DIAGNOSIS — H338 Other retinal detachments: Secondary | ICD-10-CM | POA: Diagnosis not present

## 2015-09-05 DIAGNOSIS — H2512 Age-related nuclear cataract, left eye: Secondary | ICD-10-CM

## 2015-09-05 DIAGNOSIS — H35371 Puckering of macula, right eye: Secondary | ICD-10-CM

## 2015-09-05 DIAGNOSIS — I10 Essential (primary) hypertension: Secondary | ICD-10-CM

## 2015-10-03 ENCOUNTER — Encounter (INDEPENDENT_AMBULATORY_CARE_PROVIDER_SITE_OTHER): Payer: Medicare Other | Admitting: Ophthalmology

## 2015-10-03 DIAGNOSIS — H338 Other retinal detachments: Secondary | ICD-10-CM | POA: Diagnosis not present

## 2015-10-03 DIAGNOSIS — H353124 Nonexudative age-related macular degeneration, left eye, advanced atrophic with subfoveal involvement: Secondary | ICD-10-CM | POA: Diagnosis not present

## 2015-10-03 DIAGNOSIS — I10 Essential (primary) hypertension: Secondary | ICD-10-CM

## 2015-10-03 DIAGNOSIS — H353211 Exudative age-related macular degeneration, right eye, with active choroidal neovascularization: Secondary | ICD-10-CM

## 2015-10-03 DIAGNOSIS — H35033 Hypertensive retinopathy, bilateral: Secondary | ICD-10-CM

## 2015-10-03 DIAGNOSIS — H43813 Vitreous degeneration, bilateral: Secondary | ICD-10-CM | POA: Diagnosis not present

## 2015-10-18 ENCOUNTER — Ambulatory Visit (INDEPENDENT_AMBULATORY_CARE_PROVIDER_SITE_OTHER): Payer: Medicare Other | Admitting: Cardiovascular Disease

## 2015-10-18 ENCOUNTER — Encounter: Payer: Self-pay | Admitting: Cardiovascular Disease

## 2015-10-18 VITALS — BP 120/70 | HR 62 | Ht 74.0 in | Wt 195.2 lb

## 2015-10-18 DIAGNOSIS — I482 Chronic atrial fibrillation, unspecified: Secondary | ICD-10-CM

## 2015-10-18 DIAGNOSIS — I509 Heart failure, unspecified: Secondary | ICD-10-CM

## 2015-10-18 DIAGNOSIS — E785 Hyperlipidemia, unspecified: Secondary | ICD-10-CM

## 2015-10-18 NOTE — Progress Notes (Signed)
Cardiology Office Note   Date:  10/18/2015   ID:  Craig Simon, DOB [birth year], MRN [id number]  PCP:  Donnajean Lopes, MD  Cardiologist:   Thayer Headings, MD   Chief Complaint  Patient presents with  . Follow-up    atrial fib   1. Atrial Fibrillation 2. Hypothyroidism 3. Diabetes mellitus 4. Hyperlipidemia 5. Obstructive sleep apnea- wears CPAP at night  History of Present Illness:  Craig Simon is a 75 year old gentleman with a history of atrial fibrillation. He's done quite well from a cardiac standpoint. He's not had any episodes of chest pain or shortness of breath. He's been able to do all of his normal activities without any problems.  He has noticed some leg edema. He has not been eating much salt.  October 07, 2013:  Craig Simon is doing well. He is able to do all of his normal activities without any significant problems. He denies any chest pain or shortness breath.   October 16, 2014:  Craig Simon is a 75 y.o. male who presents for f/u of his atrial fib   No CP , no dyspnea. Still very active.[name]   October 18, 2015:  Staying very active.    BP and HR are great.   No CP or dyspena Going canoeing this Saturday.   Past Medical History  Diagnosis Date  . Chronic atrial fibrillation ([place])   . Chronic anticoagulation   . Diabetes mellitus   . Mitral regurgitation   . Hyperlipidemia   . CHF (congestive heart failure) ([place])   . Chest pain   . Leg edema   . Sleep apnea     has cpap but cannot tolerate  . Internal hemorrhoids without mention of complication [id number]  . Hypothyroidism   . Macular degeneration     Past Surgical History  Procedure Laterality Date  . Cardiac catheterization  11/03/2000    EF 40%  . US echocardiography  09/10/2004    EF 55-60%  . Doppler echocardiography  03/09/2001    EF 50%. MODERATE LVH  . Cardiovascular stress test  09/20/2004    EF 56%. NO EVIDENCE OF ISCHEMIA  . Adenoidectomy  1949  . Eye surgery  2007    right eye; detached  retina  . Cornea surgery  2009    right eye  . Hammer toe surgery  07/20/12    right  . Turbinate reduction  1989     Current Outpatient Prescriptions  Medication Sig Dispense Refill  . apixaban (ELIQUIS) 5 MG TABS tablet Take 1 tablet (5 mg total) by mouth 2 (two) times daily. 60 tablet 11  . Bevacizumab (AVASTIN) 100 MG/4ML SOLN Inject into the vein.    [name] ezetimibe (ZETIA) 10 MG tablet Take 10 mg by mouth daily.      . fish oil-omega-3 fatty acids 1000 MG capsule Take 2 g by mouth 2 (two) times daily.      . fluticasone (FLONASE) 50 MCG/ACT nasal spray Place 2 sprays into the nose as needed for rhinitis. 1 g 12  . furosemide (LASIX) 40 MG tablet TAKE 1 TABLET (40 MG TOTAL) BY MOUTH 3 (THREE) TIMES A WEEK. 36 tablet 3  . KLOR-CON M20 20 MEQ tablet TAKE 6 TABLETS BY MOUTH ONCE A WEEK 24 tablet 2  . levothyroxine (SYNTHROID, LEVOTHROID) 150 MCG tablet Take 150 mcg by mouth daily.      [name] lisinopril (PRINIVIL,ZESTRIL) 5 MG tablet Take 1 tablet (5 mg total) by mouth daily. 30 tablet 10  .  metFORMIN (GLUCOPHAGE) 500 MG tablet Take 500 mg by mouth at bedtime.      . metoprolol tartrate (LOPRESSOR) 25 MG tablet TAKE 1/2 TABLET BY MOUTH TWICE DAILY 30 tablet 6  . Multiple Vitamin (MULTIVITAMIN) tablet Take 1 tablet by mouth daily.      . multivitamin-lutein (OCUVITE-LUTEIN) CAPS Take 1 capsule by mouth daily.      . pioglitazone (ACTOS) 15 MG tablet Take 15 mg by mouth daily.      . rosuvastatin (CRESTOR) 20 MG tablet Take 20 mg by mouth daily.      . SF 1.1 % GEL dental gel Place 1 application onto teeth at bedtime.     [name] warfarin (COUMADIN) 5 MG tablet TAKE 1 TABLET ON ALL DAYS EXCEPT 1/2 TABLET ON MONDAY, WEDNESDAY, AND FRIDAY AS DIRECTED BY CLINIC (Patient not taking: Reported on 10/18/2015) 30 tablet 3   No current facility-administered medications for this visit.    Allergies:   Review of patient's allergies indicates no known allergies.    Social History:  The patient  reports that he  has never smoked. He has never used smokeless tobacco. He reports that he drinks alcohol. He reports that he does not use illicit drugs.   Family History:  The patient's family history includes Heart disease in his brother, father, and mother. There is no history of Colon cancer, Stomach cancer, Rectal cancer, or Esophageal cancer.    ROS:  Please see the history of present illness.    Review of Systems: Constitutional:  denies fever, chills, diaphoresis, appetite change and fatigue.  HEENT: denies photophobia, eye pain, redness, hearing loss, ear pain, congestion, sore throat, rhinorrhea, sneezing, neck pain, neck stiffness and tinnitus.  Respiratory: denies SOB, DOE, cough, chest tightness, and wheezing.  Cardiovascular: denies chest pain, palpitations and leg swelling.  Gastrointestinal: denies nausea, vomiting, abdominal pain, diarrhea, constipation, blood in stool.  Genitourinary: denies dysuria, urgency, frequency, hematuria, flank pain and difficulty urinating.  Musculoskeletal: denies  myalgias, back pain, joint swelling, arthralgias and gait problem.   Skin: denies pallor, rash and wound.  Neurological: denies dizziness, seizures, syncope, weakness, light-headedness, numbness and headaches.   Hematological: denies adenopathy, easy bruising, personal or family bleeding history.  Psychiatric/ Behavioral: denies suicidal ideation, mood changes, confusion, nervousness, sleep disturbance and agitation.       All other systems are reviewed and negative.    PHYSICAL EXAM: VS:  BP 120/70 mmHg  Pulse 62  Ht 6\' 2"  (1.88 m)  Wt 195 lb 3.2 oz (88.542 kg)  BMI 25.05 kg/m2 , BMI Body mass index is 25.05 kg/(m^2). GEN: Well nourished, well developed, in no acute distress.   Left eye is red  HEENT: normal Neck: no JVD, carotid bruits, or masses Cardiac: IRReg Irreg. ; no murmurs, rubs, or gallops,no edema  Respiratory:  clear to auscultation bilaterally, normal work of breathing GI:  soft, nontender, nondistended, + BS MS: no deformity or atrophy Skin: warm and dry, no rash Neuro:  Strength and sensation are intact Psych: normal   EKG:  EKG is ordered today. The ekg ordered today demonstrates atrial fib at 62.    No ST or T wave changes.    Recent Labs: 11/13/2014: BUN 17; Creatinine, Ser 0.91; Hemoglobin 13.1; Platelets 106.0*; Potassium 3.8; Sodium 136    Lipid Panel    Component Value Date/Time   CHOL 101 05/07/2011 1117   TRIG 119.0 05/07/2011 1117   HDL 48.10 05/07/2011 1117   CHOLHDL 2 05/07/2011 1117  VLDL 23.8 05/07/2011 1117   LDLCALC 29 05/07/2011 1117      Wt Readings from Last 3 Encounters:  10/18/15 195 lb 3.2 oz (88.542 kg)  01/03/15 198 lb (89.812 kg)  11/29/14 198 lb 9.6 oz (90.084 kg)      Other studies Reviewed: Additional studies/ records that were reviewed today include: INR levesl . Review of the above records demonstrates: he has been theraputic  For his INR    ASSESSMENT AND PLAN:  1.  Atrial fibrillation: The patient is doing well from an A. fib standpoint. He wants to change to Eliquis.  Will hold his coumadin today and tomorrow and start Eliquis 5 BID on Wednesday .  He'll get a CBC and basic medical profile with his medical doctor.   I'll see him again in one year.  Current medicines are reviewed at length with the patient today.  The patient does not have concerns regarding medicines.  The following changes have been made:  See above   Labs/ tests ordered today include:  No orders of the defined types were placed in this encounter.     Disposition:   FU with me in 1 year    Signed, Lyssa Hackley, Wonda Cheng, MD  10/18/2015 10:00 AM    [place], [place], [place]  [address] Phone: [phone]; Fax: [phone]

## 2015-10-18 NOTE — Patient Instructions (Signed)
Medication Instructions:  Your physician recommends that you continue on your current medications as directed. Please refer to the Current Medication list given to you today.   Labwork: NONE ORDERED  Testing/Procedures: NONE ORDERED  Follow-Up: Your physician wants you to follow-up in: 1 YEAR WITH DR. Acie Fredrickson You will receive a reminder letter in the mail two months in advance. If you don't receive a letter, please call our office to schedule the follow-up appointment.   Any Other Special Instructions Will Be Listed Below (If Applicable).     If you need a refill on your cardiac medications before your next appointment, please call your pharmacy.

## 2015-10-22 ENCOUNTER — Other Ambulatory Visit: Payer: Self-pay | Admitting: *Deleted

## 2015-10-22 MED ORDER — METOPROLOL TARTRATE 25 MG PO TABS: 12.5000 mg | ORAL_TABLET | Freq: Two times a day (BID) | ORAL | Status: DC

## 2015-10-29 DIAGNOSIS — E038 Other specified hypothyroidism: Secondary | ICD-10-CM | POA: Diagnosis not present

## 2015-10-29 DIAGNOSIS — Z125 Encounter for screening for malignant neoplasm of prostate: Secondary | ICD-10-CM | POA: Diagnosis not present

## 2015-10-29 DIAGNOSIS — E1151 Type 2 diabetes mellitus with diabetic peripheral angiopathy without gangrene: Secondary | ICD-10-CM | POA: Diagnosis not present

## 2015-10-29 DIAGNOSIS — E784 Other hyperlipidemia: Secondary | ICD-10-CM | POA: Diagnosis not present

## 2015-10-31 ENCOUNTER — Encounter (INDEPENDENT_AMBULATORY_CARE_PROVIDER_SITE_OTHER): Payer: Medicare Other | Admitting: Ophthalmology

## 2015-10-31 DIAGNOSIS — H338 Other retinal detachments: Secondary | ICD-10-CM | POA: Diagnosis not present

## 2015-10-31 DIAGNOSIS — H35033 Hypertensive retinopathy, bilateral: Secondary | ICD-10-CM

## 2015-10-31 DIAGNOSIS — H353211 Exudative age-related macular degeneration, right eye, with active choroidal neovascularization: Secondary | ICD-10-CM | POA: Diagnosis not present

## 2015-10-31 DIAGNOSIS — H43813 Vitreous degeneration, bilateral: Secondary | ICD-10-CM | POA: Diagnosis not present

## 2015-10-31 DIAGNOSIS — H353124 Nonexudative age-related macular degeneration, left eye, advanced atrophic with subfoveal involvement: Secondary | ICD-10-CM

## 2015-10-31 DIAGNOSIS — I10 Essential (primary) hypertension: Secondary | ICD-10-CM | POA: Diagnosis not present

## 2015-11-05 ENCOUNTER — Other Ambulatory Visit: Payer: Self-pay | Admitting: Cardiovascular Disease

## 2015-11-05 DIAGNOSIS — I482 Chronic atrial fibrillation: Secondary | ICD-10-CM | POA: Diagnosis not present

## 2015-11-05 DIAGNOSIS — E038 Other specified hypothyroidism: Secondary | ICD-10-CM | POA: Diagnosis not present

## 2015-11-05 DIAGNOSIS — N529 Male erectile dysfunction, unspecified: Secondary | ICD-10-CM | POA: Diagnosis not present

## 2015-11-05 DIAGNOSIS — I7389 Other specified peripheral vascular diseases: Secondary | ICD-10-CM | POA: Diagnosis not present

## 2015-11-05 DIAGNOSIS — Z0000 Encounter for general adult medical examination without abnormal findings: Secondary | ICD-10-CM | POA: Diagnosis not present

## 2015-11-05 DIAGNOSIS — E1151 Type 2 diabetes mellitus with diabetic peripheral angiopathy without gangrene: Secondary | ICD-10-CM | POA: Diagnosis not present

## 2015-11-05 DIAGNOSIS — E784 Other hyperlipidemia: Secondary | ICD-10-CM | POA: Diagnosis not present

## 2015-11-05 DIAGNOSIS — Z1389 Encounter for screening for other disorder: Secondary | ICD-10-CM | POA: Diagnosis not present

## 2015-11-05 DIAGNOSIS — H3530 Unspecified macular degeneration: Secondary | ICD-10-CM | POA: Diagnosis not present

## 2015-11-05 DIAGNOSIS — Z6825 Body mass index (BMI) 25.0-25.9, adult: Secondary | ICD-10-CM | POA: Diagnosis not present

## 2015-11-06 ENCOUNTER — Other Ambulatory Visit: Payer: Self-pay

## 2015-11-06 DIAGNOSIS — Z1212 Encounter for screening for malignant neoplasm of rectum: Secondary | ICD-10-CM | POA: Diagnosis not present

## 2015-11-06 MED ORDER — LISINOPRIL 5 MG PO TABS: 5.0000 mg | ORAL_TABLET | Freq: Every day | ORAL | Status: DC

## 2015-11-06 NOTE — Telephone Encounter (Signed)
Thayer Headings, MD at 10/18/2015 10:00 AM  lisinopril (PRINIVIL,ZESTRIL) 5 MG tabletTake 1 tablet (5 mg total) by mouth daily Patient Instructions     Medication Instructions:  Your physician recommends that you continue on your current medications as directed. Please refer to the Current Medication list given to you today.

## 2015-11-19 ENCOUNTER — Other Ambulatory Visit: Payer: Self-pay | Admitting: Cardiovascular Disease

## 2015-11-28 ENCOUNTER — Encounter (INDEPENDENT_AMBULATORY_CARE_PROVIDER_SITE_OTHER): Payer: Medicare Other | Admitting: Ophthalmology

## 2015-11-28 DIAGNOSIS — H43813 Vitreous degeneration, bilateral: Secondary | ICD-10-CM | POA: Diagnosis not present

## 2015-11-28 DIAGNOSIS — H35033 Hypertensive retinopathy, bilateral: Secondary | ICD-10-CM | POA: Diagnosis not present

## 2015-11-28 DIAGNOSIS — H353124 Nonexudative age-related macular degeneration, left eye, advanced atrophic with subfoveal involvement: Secondary | ICD-10-CM | POA: Diagnosis not present

## 2015-11-28 DIAGNOSIS — H2512 Age-related nuclear cataract, left eye: Secondary | ICD-10-CM

## 2015-11-28 DIAGNOSIS — H338 Other retinal detachments: Secondary | ICD-10-CM | POA: Diagnosis not present

## 2015-11-28 DIAGNOSIS — I10 Essential (primary) hypertension: Secondary | ICD-10-CM | POA: Diagnosis not present

## 2015-11-28 DIAGNOSIS — H353211 Exudative age-related macular degeneration, right eye, with active choroidal neovascularization: Secondary | ICD-10-CM | POA: Diagnosis not present

## 2015-12-26 ENCOUNTER — Encounter (INDEPENDENT_AMBULATORY_CARE_PROVIDER_SITE_OTHER): Payer: Medicare Other | Admitting: Ophthalmology

## 2015-12-26 DIAGNOSIS — H35033 Hypertensive retinopathy, bilateral: Secondary | ICD-10-CM

## 2015-12-26 DIAGNOSIS — I10 Essential (primary) hypertension: Secondary | ICD-10-CM | POA: Diagnosis not present

## 2015-12-26 DIAGNOSIS — H353211 Exudative age-related macular degeneration, right eye, with active choroidal neovascularization: Secondary | ICD-10-CM | POA: Diagnosis not present

## 2015-12-26 DIAGNOSIS — H43813 Vitreous degeneration, bilateral: Secondary | ICD-10-CM

## 2015-12-26 DIAGNOSIS — H353122 Nonexudative age-related macular degeneration, left eye, intermediate dry stage: Secondary | ICD-10-CM | POA: Diagnosis not present

## 2015-12-26 DIAGNOSIS — H338 Other retinal detachments: Secondary | ICD-10-CM

## 2016-01-23 ENCOUNTER — Encounter (INDEPENDENT_AMBULATORY_CARE_PROVIDER_SITE_OTHER): Payer: Medicare Other | Admitting: Ophthalmology

## 2016-01-23 DIAGNOSIS — H353122 Nonexudative age-related macular degeneration, left eye, intermediate dry stage: Secondary | ICD-10-CM

## 2016-01-23 DIAGNOSIS — H338 Other retinal detachments: Secondary | ICD-10-CM

## 2016-01-23 DIAGNOSIS — H43813 Vitreous degeneration, bilateral: Secondary | ICD-10-CM | POA: Diagnosis not present

## 2016-01-23 DIAGNOSIS — H353211 Exudative age-related macular degeneration, right eye, with active choroidal neovascularization: Secondary | ICD-10-CM

## 2016-01-23 DIAGNOSIS — I10 Essential (primary) hypertension: Secondary | ICD-10-CM

## 2016-01-23 DIAGNOSIS — H35033 Hypertensive retinopathy, bilateral: Secondary | ICD-10-CM | POA: Diagnosis not present

## 2016-02-09 ENCOUNTER — Other Ambulatory Visit: Payer: Self-pay | Admitting: Cardiovascular Disease

## 2016-02-11 ENCOUNTER — Other Ambulatory Visit: Payer: Self-pay | Admitting: *Deleted

## 2016-02-11 MED ORDER — FUROSEMIDE 40 MG PO TABS: ORAL_TABLET | ORAL | 3 refills | Status: DC

## 2016-02-19 ENCOUNTER — Encounter (INDEPENDENT_AMBULATORY_CARE_PROVIDER_SITE_OTHER): Payer: Medicare Other | Admitting: Ophthalmology

## 2016-02-19 DIAGNOSIS — H43813 Vitreous degeneration, bilateral: Secondary | ICD-10-CM | POA: Diagnosis not present

## 2016-02-19 DIAGNOSIS — H35033 Hypertensive retinopathy, bilateral: Secondary | ICD-10-CM | POA: Diagnosis not present

## 2016-02-19 DIAGNOSIS — I10 Essential (primary) hypertension: Secondary | ICD-10-CM

## 2016-02-19 DIAGNOSIS — H353211 Exudative age-related macular degeneration, right eye, with active choroidal neovascularization: Secondary | ICD-10-CM

## 2016-02-19 DIAGNOSIS — H2512 Age-related nuclear cataract, left eye: Secondary | ICD-10-CM | POA: Diagnosis not present

## 2016-02-19 DIAGNOSIS — H353124 Nonexudative age-related macular degeneration, left eye, advanced atrophic with subfoveal involvement: Secondary | ICD-10-CM

## 2016-03-18 ENCOUNTER — Encounter (INDEPENDENT_AMBULATORY_CARE_PROVIDER_SITE_OTHER): Payer: Medicare Other | Admitting: Ophthalmology

## 2016-03-18 DIAGNOSIS — I10 Essential (primary) hypertension: Secondary | ICD-10-CM | POA: Diagnosis not present

## 2016-03-18 DIAGNOSIS — H353211 Exudative age-related macular degeneration, right eye, with active choroidal neovascularization: Secondary | ICD-10-CM | POA: Diagnosis not present

## 2016-03-18 DIAGNOSIS — H353124 Nonexudative age-related macular degeneration, left eye, advanced atrophic with subfoveal involvement: Secondary | ICD-10-CM

## 2016-03-18 DIAGNOSIS — H35033 Hypertensive retinopathy, bilateral: Secondary | ICD-10-CM | POA: Diagnosis not present

## 2016-03-18 DIAGNOSIS — H338 Other retinal detachments: Secondary | ICD-10-CM | POA: Diagnosis not present

## 2016-03-18 DIAGNOSIS — H43813 Vitreous degeneration, bilateral: Secondary | ICD-10-CM | POA: Diagnosis not present

## 2016-03-18 DIAGNOSIS — H2512 Age-related nuclear cataract, left eye: Secondary | ICD-10-CM | POA: Diagnosis not present

## 2016-04-18 ENCOUNTER — Encounter (INDEPENDENT_AMBULATORY_CARE_PROVIDER_SITE_OTHER): Payer: Medicare Other | Admitting: Ophthalmology

## 2016-04-18 DIAGNOSIS — H43813 Vitreous degeneration, bilateral: Secondary | ICD-10-CM

## 2016-04-18 DIAGNOSIS — I10 Essential (primary) hypertension: Secondary | ICD-10-CM | POA: Diagnosis not present

## 2016-04-18 DIAGNOSIS — H353211 Exudative age-related macular degeneration, right eye, with active choroidal neovascularization: Secondary | ICD-10-CM | POA: Diagnosis not present

## 2016-04-18 DIAGNOSIS — H35033 Hypertensive retinopathy, bilateral: Secondary | ICD-10-CM | POA: Diagnosis not present

## 2016-04-18 DIAGNOSIS — H353124 Nonexudative age-related macular degeneration, left eye, advanced atrophic with subfoveal involvement: Secondary | ICD-10-CM | POA: Diagnosis not present

## 2016-04-18 DIAGNOSIS — H338 Other retinal detachments: Secondary | ICD-10-CM | POA: Diagnosis not present

## 2016-04-19 DIAGNOSIS — Z23 Encounter for immunization: Secondary | ICD-10-CM | POA: Diagnosis not present

## 2016-04-30 ENCOUNTER — Other Ambulatory Visit: Payer: Self-pay | Admitting: Cardiovascular Disease

## 2016-05-23 ENCOUNTER — Encounter (INDEPENDENT_AMBULATORY_CARE_PROVIDER_SITE_OTHER): Payer: Medicare Other | Admitting: Ophthalmology

## 2016-05-23 DIAGNOSIS — H2512 Age-related nuclear cataract, left eye: Secondary | ICD-10-CM

## 2016-05-23 DIAGNOSIS — H35371 Puckering of macula, right eye: Secondary | ICD-10-CM

## 2016-05-23 DIAGNOSIS — H35033 Hypertensive retinopathy, bilateral: Secondary | ICD-10-CM | POA: Diagnosis not present

## 2016-05-23 DIAGNOSIS — H353124 Nonexudative age-related macular degeneration, left eye, advanced atrophic with subfoveal involvement: Secondary | ICD-10-CM

## 2016-05-23 DIAGNOSIS — H338 Other retinal detachments: Secondary | ICD-10-CM

## 2016-05-23 DIAGNOSIS — I10 Essential (primary) hypertension: Secondary | ICD-10-CM

## 2016-05-23 DIAGNOSIS — H43813 Vitreous degeneration, bilateral: Secondary | ICD-10-CM

## 2016-05-23 DIAGNOSIS — H353211 Exudative age-related macular degeneration, right eye, with active choroidal neovascularization: Secondary | ICD-10-CM

## 2016-06-27 ENCOUNTER — Encounter (INDEPENDENT_AMBULATORY_CARE_PROVIDER_SITE_OTHER): Payer: Medicare Other | Admitting: Ophthalmology

## 2016-06-27 DIAGNOSIS — H35371 Puckering of macula, right eye: Secondary | ICD-10-CM | POA: Diagnosis not present

## 2016-06-27 DIAGNOSIS — H353124 Nonexudative age-related macular degeneration, left eye, advanced atrophic with subfoveal involvement: Secondary | ICD-10-CM | POA: Diagnosis not present

## 2016-06-27 DIAGNOSIS — H35033 Hypertensive retinopathy, bilateral: Secondary | ICD-10-CM

## 2016-06-27 DIAGNOSIS — I10 Essential (primary) hypertension: Secondary | ICD-10-CM

## 2016-06-27 DIAGNOSIS — H353211 Exudative age-related macular degeneration, right eye, with active choroidal neovascularization: Secondary | ICD-10-CM

## 2016-06-27 DIAGNOSIS — H338 Other retinal detachments: Secondary | ICD-10-CM

## 2016-07-31 ENCOUNTER — Encounter (INDEPENDENT_AMBULATORY_CARE_PROVIDER_SITE_OTHER): Payer: Medicare Other | Admitting: Ophthalmology

## 2016-07-31 DIAGNOSIS — H353211 Exudative age-related macular degeneration, right eye, with active choroidal neovascularization: Secondary | ICD-10-CM | POA: Diagnosis not present

## 2016-07-31 DIAGNOSIS — I10 Essential (primary) hypertension: Secondary | ICD-10-CM | POA: Diagnosis not present

## 2016-07-31 DIAGNOSIS — H35033 Hypertensive retinopathy, bilateral: Secondary | ICD-10-CM | POA: Diagnosis not present

## 2016-07-31 DIAGNOSIS — H353124 Nonexudative age-related macular degeneration, left eye, advanced atrophic with subfoveal involvement: Secondary | ICD-10-CM | POA: Diagnosis not present

## 2016-07-31 DIAGNOSIS — H338 Other retinal detachments: Secondary | ICD-10-CM

## 2016-07-31 DIAGNOSIS — H2512 Age-related nuclear cataract, left eye: Secondary | ICD-10-CM

## 2016-07-31 DIAGNOSIS — H35371 Puckering of macula, right eye: Secondary | ICD-10-CM

## 2016-07-31 DIAGNOSIS — H43813 Vitreous degeneration, bilateral: Secondary | ICD-10-CM | POA: Diagnosis not present

## 2016-08-12 DIAGNOSIS — H353124 Nonexudative age-related macular degeneration, left eye, advanced atrophic with subfoveal involvement: Secondary | ICD-10-CM | POA: Diagnosis not present

## 2016-08-12 DIAGNOSIS — H2512 Age-related nuclear cataract, left eye: Secondary | ICD-10-CM | POA: Diagnosis not present

## 2016-08-12 DIAGNOSIS — Z961 Presence of intraocular lens: Secondary | ICD-10-CM | POA: Diagnosis not present

## 2016-08-12 DIAGNOSIS — H353212 Exudative age-related macular degeneration, right eye, with inactive choroidal neovascularization: Secondary | ICD-10-CM | POA: Diagnosis not present

## 2016-08-28 ENCOUNTER — Encounter (INDEPENDENT_AMBULATORY_CARE_PROVIDER_SITE_OTHER): Payer: Medicare Other | Admitting: Ophthalmology

## 2016-08-28 DIAGNOSIS — H353211 Exudative age-related macular degeneration, right eye, with active choroidal neovascularization: Secondary | ICD-10-CM

## 2016-08-28 DIAGNOSIS — H353124 Nonexudative age-related macular degeneration, left eye, advanced atrophic with subfoveal involvement: Secondary | ICD-10-CM

## 2016-08-28 DIAGNOSIS — I10 Essential (primary) hypertension: Secondary | ICD-10-CM

## 2016-08-28 DIAGNOSIS — H338 Other retinal detachments: Secondary | ICD-10-CM | POA: Diagnosis not present

## 2016-08-28 DIAGNOSIS — H35033 Hypertensive retinopathy, bilateral: Secondary | ICD-10-CM

## 2016-08-28 DIAGNOSIS — H43813 Vitreous degeneration, bilateral: Secondary | ICD-10-CM | POA: Diagnosis not present

## 2016-09-22 ENCOUNTER — Encounter (INDEPENDENT_AMBULATORY_CARE_PROVIDER_SITE_OTHER): Payer: Medicare Other | Admitting: Ophthalmology

## 2016-09-22 DIAGNOSIS — H35033 Hypertensive retinopathy, bilateral: Secondary | ICD-10-CM | POA: Diagnosis not present

## 2016-09-22 DIAGNOSIS — H353124 Nonexudative age-related macular degeneration, left eye, advanced atrophic with subfoveal involvement: Secondary | ICD-10-CM | POA: Diagnosis not present

## 2016-09-22 DIAGNOSIS — I10 Essential (primary) hypertension: Secondary | ICD-10-CM | POA: Diagnosis not present

## 2016-09-22 DIAGNOSIS — H2512 Age-related nuclear cataract, left eye: Secondary | ICD-10-CM

## 2016-09-22 DIAGNOSIS — H353211 Exudative age-related macular degeneration, right eye, with active choroidal neovascularization: Secondary | ICD-10-CM

## 2016-09-22 DIAGNOSIS — H338 Other retinal detachments: Secondary | ICD-10-CM

## 2016-09-22 DIAGNOSIS — H43813 Vitreous degeneration, bilateral: Secondary | ICD-10-CM | POA: Diagnosis not present

## 2016-10-07 ENCOUNTER — Encounter: Payer: Self-pay | Admitting: Cardiovascular Disease

## 2016-10-19 ENCOUNTER — Other Ambulatory Visit: Payer: Self-pay | Admitting: Cardiovascular Disease

## 2016-10-22 ENCOUNTER — Encounter: Payer: Self-pay | Admitting: Cardiovascular Disease

## 2016-10-22 ENCOUNTER — Ambulatory Visit (INDEPENDENT_AMBULATORY_CARE_PROVIDER_SITE_OTHER): Payer: Medicare Other | Admitting: Cardiovascular Disease

## 2016-10-22 VITALS — BP 122/64 | HR 68 | Ht 74.0 in | Wt 193.8 lb

## 2016-10-22 DIAGNOSIS — I482 Chronic atrial fibrillation, unspecified: Secondary | ICD-10-CM

## 2016-10-22 NOTE — Progress Notes (Signed)
Cardiology Office Note   Date:  10/22/2016   ID:  Craig Simon, DOB 03/10/1941, MRN [id number]  PCP:  Craig Lopes, MD  Cardiologist:   Craig Moores, MD   Chief Complaint  Patient presents with  . Follow-up    atrial fib, CHF   1. Atrial Fibrillation 2. Hypothyroidism 3. Diabetes mellitus 4. Hyperlipidemia 5. Obstructive sleep apnea- wears CPAP at night  History of Present Illness:  Craig Simon is a 76 year old gentleman with a history of atrial fibrillation. He's done quite well from a cardiac standpoint. He's not had any episodes of chest pain or shortness of breath. He's been able to do all of his normal activities without any problems.  He has noticed some leg edema. He has not been eating much salt.  October 07, 2013:  Craig Simon is doing well. He is able to do all of his normal activities without any significant problems. He denies any chest pain or shortness breath.   October 16, 2014:  Craig Simon is a 76 y.o. male who presents for f/u of his atrial fib   No CP , no dyspnea. Still very active.Craig Simon   October 18, 2015:  Staying very active.    BP and HR are great.   No CP or dyspena Going canoeing this Saturday.   October 22, 2016:    Craig Simon  seen back today for further evaluation of his chronic atrial fibrillation   Under lots of stress .    Past Medical History:  Diagnosis Date  . Chest pain   . CHF (congestive heart failure) ([place])   . Chronic anticoagulation   . Chronic atrial fibrillation ([place])   . Diabetes mellitus   . Hyperlipidemia   . Hypothyroidism   . Internal hemorrhoids without mention of complication 37/34/2876  . Leg edema   . Macular degeneration   . Mitral regurgitation   . Sleep apnea    has cpap but cannot tolerate    Past Surgical History:  Procedure Laterality Date  . ADENOIDECTOMY  1949  . CARDIAC CATHETERIZATION  11/03/2000   EF 40%  . CARDIOVASCULAR STRESS TEST  09/20/2004   EF 56%. NO EVIDENCE OF ISCHEMIA  . cornea surgery  2009     right eye  . DOPPLER ECHOCARDIOGRAPHY  03/09/2001   EF 50%. MODERATE LVH  . EYE SURGERY  2007   right eye; detached retina  . HAMMER TOE SURGERY  07/20/12   right  . [place]  . US ECHOCARDIOGRAPHY  09/10/2004   EF 55-60%     Current Outpatient Prescriptions  Medication Sig Dispense Refill  . Bevacizumab (AVASTIN) 100 MG/4ML SOLN Inject into the vein.    Craig Simon ELIQUIS 5 MG TABS tablet TAKE 1 TABLET (5 MG TOTAL) BY MOUTH 2 (TWO) TIMES DAILY. 60 tablet 11  . ezetimibe (ZETIA) 10 MG tablet Take 10 mg by mouth daily.      . fish oil-omega-3 fatty acids 1000 MG capsule Take 2 g by mouth 2 (two) times daily.      . fluticasone (FLONASE) 50 MCG/ACT nasal spray Place 2 sprays into the nose as needed for rhinitis. 1 g 12  . furosemide (LASIX) 40 MG tablet Take one tablet by mouth 3 times a week 36 tablet 3  . KLOR-CON M20 20 MEQ tablet TAKE 6 TABLETS BY MOUTH ONCE A WEEK 24 tablet 11  . levothyroxine (SYNTHROID, LEVOTHROID) 150 MCG tablet Take 150 mcg by mouth daily.      Craig Simon lisinopril (  PRINIVIL,ZESTRIL) 5 MG tablet TAKE 1 TABLET (5 MG TOTAL) BY MOUTH DAILY. 90 tablet 1  . metFORMIN (GLUCOPHAGE) 500 MG tablet Take 500 mg by mouth at bedtime.      . metoprolol tartrate (LOPRESSOR) 25 MG tablet Take 0.5 tablets (12.5 mg total) by mouth 2 (two) times daily. 90 tablet 3  . Multiple Vitamin (MULTIVITAMIN) tablet Take 1 tablet by mouth daily.      . multivitamin-lutein (OCUVITE-LUTEIN) CAPS Take 1 capsule by mouth daily.      . pioglitazone (ACTOS) 15 MG tablet Take 15 mg by mouth daily.      . rosuvastatin (CRESTOR) 20 MG tablet Take 20 mg by mouth daily.      . SF 1.1 % GEL dental gel Place 1 application onto teeth at bedtime.      No current facility-administered medications for this visit.     Allergies:   Patient has no known allergies.    Social History:  The patient  reports that he has never smoked. He has never used smokeless tobacco. He reports that he drinks alcohol. He  reports that he does not use drugs.   Family History:  The patient's family history includes Heart disease in his brother, father, and mother.    ROS:  Please see the history of present illness.    Review of Systems: Constitutional:  denies fever, chills, diaphoresis, appetite change and fatigue.  HEENT: denies photophobia, eye pain, redness, hearing loss, ear pain, congestion, sore throat, rhinorrhea, sneezing, neck pain, neck stiffness and tinnitus.  Respiratory: denies SOB, DOE, cough, chest tightness, and wheezing.  Cardiovascular: denies chest pain, palpitations and leg swelling.  Gastrointestinal: denies nausea, vomiting, abdominal pain, diarrhea, constipation, blood in stool.  Genitourinary: denies dysuria, urgency, frequency, hematuria, flank pain and difficulty urinating.  Musculoskeletal: denies  myalgias, back pain, joint swelling, arthralgias and gait problem.   Skin: denies pallor, rash and wound.  Neurological: denies dizziness, seizures, syncope, weakness, light-headedness, numbness and headaches.   Hematological: denies adenopathy, easy bruising, personal or family bleeding history.  Psychiatric/ Behavioral: denies suicidal ideation, mood changes, confusion, nervousness, sleep disturbance and agitation.       All other systems are reviewed and negative.    PHYSICAL EXAM: VS:  BP 122/64 (BP Location: Left Arm, Patient Position: Sitting, Cuff Size: Normal)   Pulse 68   Ht 6\' 2"  (1.88 m)   Wt 193 lb 12.8 oz (87.9 kg)   SpO2 97%   BMI 24.88 kg/m  , BMI Body mass index is 24.88 kg/m. GEN: Well nourished, well developed, in no acute distress .   Left eye is red  HEENT: normal  Neck: no JVD, carotid bruits, or masses Cardiac: IRReg Irreg. ; no murmurs, rubs, or gallops,no edema  Respiratory:  clear to auscultation bilaterally, normal work of breathing GI: soft, nontender, nondistended, + BS MS: no deformity or atrophy  Skin: warm and dry, no rash Neuro:  Strength  and sensation are intact Psych: normal   EKG:  EKG is ordered today. The ekg ordered today demonstrates atrial fib at 68.    No ST or T wave changes.    Recent Labs: No results found for requested labs within last 8760 hours.    Lipid Panel    Component Value Date/Time   CHOL 101 05/07/2011 1117   TRIG 119.0 05/07/2011 1117   HDL 48.10 05/07/2011 1117   CHOLHDL 2 05/07/2011 1117   VLDL 23.8 05/07/2011 1117   LDLCALC 29 05/07/2011 1117  Wt Readings from Last 3 Encounters:  10/22/16 193 lb 12.8 oz (87.9 kg)  10/18/15 195 lb 3.2 oz (88.5 kg)  01/03/15 198 lb (89.8 kg)      Other studies Reviewed: Additional studies/ records that were reviewed today include: INR levesl . Review of the above records demonstrates: he has been theraputic  For his INR    ASSESSMENT AND PLAN:  1.  Atrial fibrillation: The patient is doing well from an A. fib standpoint.  Is doing well on the Eliquis    I'll see him again in one year.  Current medicines are reviewed at length with the patient today.  The patient does not have concerns regarding medicines.  The following changes have been made:  See above   Labs/ tests ordered today include:  No orders of the defined types were placed in this encounter.    Disposition:   FU with me in 1 year    Signed, Craig Moores, MD  10/22/2016 3:00 PM    [address], [place], [place]  [address] Phone: [phone]; Fax: [phone]

## 2016-10-22 NOTE — Patient Instructions (Signed)
Medication Instructions:  Your physician recommends that you continue on your current medications as directed. Please refer to the Current Medication list given to you today.   Labwork: None Ordered   Testing/Procedures: None Ordered   Follow-Up: Your physician wants you to follow-up in: 1 year with Dr. Nahser.  You will receive a reminder letter in the mail two months in advance. If you don't receive a letter, please call our office to schedule the follow-up appointment.   If you need a refill on your cardiac medications before your next appointment, please call your pharmacy.   Thank you for choosing [facility]! Damika Harmon, RN [phone]    

## 2016-10-23 ENCOUNTER — Other Ambulatory Visit: Payer: Self-pay | Admitting: *Deleted

## 2016-10-23 MED ORDER — APIXABAN 5 MG PO TABS: 5.0000 mg | ORAL_TABLET | Freq: Two times a day (BID) | ORAL | 5 refills | Status: DC

## 2016-10-26 ENCOUNTER — Other Ambulatory Visit: Payer: Self-pay | Admitting: Cardiovascular Disease

## 2016-10-27 ENCOUNTER — Encounter (INDEPENDENT_AMBULATORY_CARE_PROVIDER_SITE_OTHER): Payer: Medicare Other | Admitting: Ophthalmology

## 2016-10-27 DIAGNOSIS — H35033 Hypertensive retinopathy, bilateral: Secondary | ICD-10-CM

## 2016-10-27 DIAGNOSIS — I10 Essential (primary) hypertension: Secondary | ICD-10-CM

## 2016-10-27 DIAGNOSIS — H338 Other retinal detachments: Secondary | ICD-10-CM | POA: Diagnosis not present

## 2016-10-27 DIAGNOSIS — H2512 Age-related nuclear cataract, left eye: Secondary | ICD-10-CM | POA: Diagnosis not present

## 2016-10-27 DIAGNOSIS — H353211 Exudative age-related macular degeneration, right eye, with active choroidal neovascularization: Secondary | ICD-10-CM | POA: Diagnosis not present

## 2016-10-27 DIAGNOSIS — H353124 Nonexudative age-related macular degeneration, left eye, advanced atrophic with subfoveal involvement: Secondary | ICD-10-CM | POA: Diagnosis not present

## 2016-10-27 DIAGNOSIS — H43813 Vitreous degeneration, bilateral: Secondary | ICD-10-CM

## 2016-10-28 ENCOUNTER — Other Ambulatory Visit: Payer: Self-pay | Admitting: Cardiovascular Disease

## 2016-11-03 DIAGNOSIS — Z125 Encounter for screening for malignant neoplasm of prostate: Secondary | ICD-10-CM | POA: Diagnosis not present

## 2016-11-03 DIAGNOSIS — E784 Other hyperlipidemia: Secondary | ICD-10-CM | POA: Diagnosis not present

## 2016-11-03 DIAGNOSIS — E038 Other specified hypothyroidism: Secondary | ICD-10-CM | POA: Diagnosis not present

## 2016-11-03 DIAGNOSIS — E1151 Type 2 diabetes mellitus with diabetic peripheral angiopathy without gangrene: Secondary | ICD-10-CM | POA: Diagnosis not present

## 2016-11-10 DIAGNOSIS — Z0000 Encounter for general adult medical examination without abnormal findings: Secondary | ICD-10-CM | POA: Diagnosis not present

## 2016-11-10 DIAGNOSIS — I7389 Other specified peripheral vascular diseases: Secondary | ICD-10-CM | POA: Diagnosis not present

## 2016-11-10 DIAGNOSIS — Z6825 Body mass index (BMI) 25.0-25.9, adult: Secondary | ICD-10-CM | POA: Diagnosis not present

## 2016-11-10 DIAGNOSIS — E038 Other specified hypothyroidism: Secondary | ICD-10-CM | POA: Diagnosis not present

## 2016-11-10 DIAGNOSIS — H3530 Unspecified macular degeneration: Secondary | ICD-10-CM | POA: Diagnosis not present

## 2016-11-10 DIAGNOSIS — M79671 Pain in right foot: Secondary | ICD-10-CM | POA: Diagnosis not present

## 2016-11-10 DIAGNOSIS — E784 Other hyperlipidemia: Secondary | ICD-10-CM | POA: Diagnosis not present

## 2016-11-10 DIAGNOSIS — E1151 Type 2 diabetes mellitus with diabetic peripheral angiopathy without gangrene: Secondary | ICD-10-CM | POA: Diagnosis not present

## 2016-11-10 DIAGNOSIS — Z1389 Encounter for screening for other disorder: Secondary | ICD-10-CM | POA: Diagnosis not present

## 2016-11-10 DIAGNOSIS — I482 Chronic atrial fibrillation: Secondary | ICD-10-CM | POA: Diagnosis not present

## 2016-11-11 DIAGNOSIS — Z1212 Encounter for screening for malignant neoplasm of rectum: Secondary | ICD-10-CM | POA: Diagnosis not present

## 2016-11-24 ENCOUNTER — Encounter (INDEPENDENT_AMBULATORY_CARE_PROVIDER_SITE_OTHER): Payer: Medicare Other | Admitting: Ophthalmology

## 2016-11-24 DIAGNOSIS — H2512 Age-related nuclear cataract, left eye: Secondary | ICD-10-CM | POA: Diagnosis not present

## 2016-11-24 DIAGNOSIS — H353122 Nonexudative age-related macular degeneration, left eye, intermediate dry stage: Secondary | ICD-10-CM

## 2016-11-24 DIAGNOSIS — I10 Essential (primary) hypertension: Secondary | ICD-10-CM

## 2016-11-24 DIAGNOSIS — H338 Other retinal detachments: Secondary | ICD-10-CM

## 2016-11-24 DIAGNOSIS — H35371 Puckering of macula, right eye: Secondary | ICD-10-CM

## 2016-11-24 DIAGNOSIS — H43813 Vitreous degeneration, bilateral: Secondary | ICD-10-CM | POA: Diagnosis not present

## 2016-11-24 DIAGNOSIS — H353211 Exudative age-related macular degeneration, right eye, with active choroidal neovascularization: Secondary | ICD-10-CM

## 2016-11-24 DIAGNOSIS — H35033 Hypertensive retinopathy, bilateral: Secondary | ICD-10-CM | POA: Diagnosis not present

## 2016-12-22 ENCOUNTER — Encounter (INDEPENDENT_AMBULATORY_CARE_PROVIDER_SITE_OTHER): Payer: Medicare Other | Admitting: Ophthalmology

## 2016-12-22 DIAGNOSIS — H35033 Hypertensive retinopathy, bilateral: Secondary | ICD-10-CM | POA: Diagnosis not present

## 2016-12-22 DIAGNOSIS — H43813 Vitreous degeneration, bilateral: Secondary | ICD-10-CM | POA: Diagnosis not present

## 2016-12-22 DIAGNOSIS — H353211 Exudative age-related macular degeneration, right eye, with active choroidal neovascularization: Secondary | ICD-10-CM

## 2016-12-22 DIAGNOSIS — H353122 Nonexudative age-related macular degeneration, left eye, intermediate dry stage: Secondary | ICD-10-CM

## 2016-12-22 DIAGNOSIS — H338 Other retinal detachments: Secondary | ICD-10-CM | POA: Diagnosis not present

## 2016-12-22 DIAGNOSIS — I10 Essential (primary) hypertension: Secondary | ICD-10-CM

## 2017-01-16 ENCOUNTER — Encounter (INDEPENDENT_AMBULATORY_CARE_PROVIDER_SITE_OTHER): Payer: Medicare Other | Admitting: Ophthalmology

## 2017-01-16 DIAGNOSIS — H43813 Vitreous degeneration, bilateral: Secondary | ICD-10-CM | POA: Diagnosis not present

## 2017-01-16 DIAGNOSIS — H35371 Puckering of macula, right eye: Secondary | ICD-10-CM

## 2017-01-16 DIAGNOSIS — H353122 Nonexudative age-related macular degeneration, left eye, intermediate dry stage: Secondary | ICD-10-CM

## 2017-01-16 DIAGNOSIS — I10 Essential (primary) hypertension: Secondary | ICD-10-CM

## 2017-01-16 DIAGNOSIS — H35033 Hypertensive retinopathy, bilateral: Secondary | ICD-10-CM

## 2017-01-16 DIAGNOSIS — H338 Other retinal detachments: Secondary | ICD-10-CM | POA: Diagnosis not present

## 2017-01-16 DIAGNOSIS — H353211 Exudative age-related macular degeneration, right eye, with active choroidal neovascularization: Secondary | ICD-10-CM | POA: Diagnosis not present

## 2017-01-29 ENCOUNTER — Other Ambulatory Visit: Payer: Self-pay | Admitting: Cardiovascular Disease

## 2017-02-13 ENCOUNTER — Encounter (INDEPENDENT_AMBULATORY_CARE_PROVIDER_SITE_OTHER): Payer: Medicare Other | Admitting: Ophthalmology

## 2017-02-13 DIAGNOSIS — H353124 Nonexudative age-related macular degeneration, left eye, advanced atrophic with subfoveal involvement: Secondary | ICD-10-CM | POA: Diagnosis not present

## 2017-02-13 DIAGNOSIS — H338 Other retinal detachments: Secondary | ICD-10-CM | POA: Diagnosis not present

## 2017-02-13 DIAGNOSIS — H353211 Exudative age-related macular degeneration, right eye, with active choroidal neovascularization: Secondary | ICD-10-CM

## 2017-02-13 DIAGNOSIS — H35033 Hypertensive retinopathy, bilateral: Secondary | ICD-10-CM

## 2017-02-13 DIAGNOSIS — H43813 Vitreous degeneration, bilateral: Secondary | ICD-10-CM

## 2017-02-13 DIAGNOSIS — I10 Essential (primary) hypertension: Secondary | ICD-10-CM

## 2017-03-13 ENCOUNTER — Encounter (INDEPENDENT_AMBULATORY_CARE_PROVIDER_SITE_OTHER): Payer: Medicare Other | Admitting: Ophthalmology

## 2017-03-13 DIAGNOSIS — H353124 Nonexudative age-related macular degeneration, left eye, advanced atrophic with subfoveal involvement: Secondary | ICD-10-CM

## 2017-03-13 DIAGNOSIS — H353211 Exudative age-related macular degeneration, right eye, with active choroidal neovascularization: Secondary | ICD-10-CM | POA: Diagnosis not present

## 2017-03-13 DIAGNOSIS — I10 Essential (primary) hypertension: Secondary | ICD-10-CM | POA: Diagnosis not present

## 2017-03-13 DIAGNOSIS — H35033 Hypertensive retinopathy, bilateral: Secondary | ICD-10-CM | POA: Diagnosis not present

## 2017-03-13 DIAGNOSIS — H35371 Puckering of macula, right eye: Secondary | ICD-10-CM

## 2017-03-13 DIAGNOSIS — H338 Other retinal detachments: Secondary | ICD-10-CM

## 2017-03-13 DIAGNOSIS — H43813 Vitreous degeneration, bilateral: Secondary | ICD-10-CM

## 2017-04-14 ENCOUNTER — Other Ambulatory Visit: Payer: Self-pay | Admitting: Cardiovascular Disease

## 2017-04-15 NOTE — Telephone Encounter (Signed)
Pt last saw Dr Acie Fredrickson 10/22/16, last labs at primary care MD 11/03/16 Creat 0.8, age 76, weight 87.9kg, based on specified criteria pt is on appropriate dosage of Eliquis 5mg  BID.  Will refill rx.

## 2017-04-17 ENCOUNTER — Encounter (INDEPENDENT_AMBULATORY_CARE_PROVIDER_SITE_OTHER): Payer: Medicare Other | Admitting: Ophthalmology

## 2017-04-17 DIAGNOSIS — H353122 Nonexudative age-related macular degeneration, left eye, intermediate dry stage: Secondary | ICD-10-CM | POA: Diagnosis not present

## 2017-04-17 DIAGNOSIS — H35371 Puckering of macula, right eye: Secondary | ICD-10-CM | POA: Diagnosis not present

## 2017-04-17 DIAGNOSIS — H353211 Exudative age-related macular degeneration, right eye, with active choroidal neovascularization: Secondary | ICD-10-CM

## 2017-04-17 DIAGNOSIS — H35033 Hypertensive retinopathy, bilateral: Secondary | ICD-10-CM

## 2017-04-17 DIAGNOSIS — I10 Essential (primary) hypertension: Secondary | ICD-10-CM

## 2017-04-17 DIAGNOSIS — H43813 Vitreous degeneration, bilateral: Secondary | ICD-10-CM

## 2017-05-02 DIAGNOSIS — Z23 Encounter for immunization: Secondary | ICD-10-CM | POA: Diagnosis not present

## 2017-05-22 ENCOUNTER — Encounter (INDEPENDENT_AMBULATORY_CARE_PROVIDER_SITE_OTHER): Payer: Medicare Other | Admitting: Ophthalmology

## 2017-05-22 DIAGNOSIS — H35033 Hypertensive retinopathy, bilateral: Secondary | ICD-10-CM

## 2017-05-22 DIAGNOSIS — H43813 Vitreous degeneration, bilateral: Secondary | ICD-10-CM | POA: Diagnosis not present

## 2017-05-22 DIAGNOSIS — H338 Other retinal detachments: Secondary | ICD-10-CM

## 2017-05-22 DIAGNOSIS — H353124 Nonexudative age-related macular degeneration, left eye, advanced atrophic with subfoveal involvement: Secondary | ICD-10-CM

## 2017-05-22 DIAGNOSIS — H2512 Age-related nuclear cataract, left eye: Secondary | ICD-10-CM | POA: Diagnosis not present

## 2017-05-22 DIAGNOSIS — H353211 Exudative age-related macular degeneration, right eye, with active choroidal neovascularization: Secondary | ICD-10-CM

## 2017-05-22 DIAGNOSIS — I10 Essential (primary) hypertension: Secondary | ICD-10-CM | POA: Diagnosis not present

## 2017-06-26 ENCOUNTER — Encounter (INDEPENDENT_AMBULATORY_CARE_PROVIDER_SITE_OTHER): Payer: Medicare Other | Admitting: Ophthalmology

## 2017-06-26 DIAGNOSIS — H35033 Hypertensive retinopathy, bilateral: Secondary | ICD-10-CM

## 2017-06-26 DIAGNOSIS — H35371 Puckering of macula, right eye: Secondary | ICD-10-CM

## 2017-06-26 DIAGNOSIS — H353122 Nonexudative age-related macular degeneration, left eye, intermediate dry stage: Secondary | ICD-10-CM

## 2017-06-26 DIAGNOSIS — H43813 Vitreous degeneration, bilateral: Secondary | ICD-10-CM

## 2017-06-26 DIAGNOSIS — H2512 Age-related nuclear cataract, left eye: Secondary | ICD-10-CM

## 2017-06-26 DIAGNOSIS — I10 Essential (primary) hypertension: Secondary | ICD-10-CM | POA: Diagnosis not present

## 2017-06-26 DIAGNOSIS — H353211 Exudative age-related macular degeneration, right eye, with active choroidal neovascularization: Secondary | ICD-10-CM | POA: Diagnosis not present

## 2017-06-26 DIAGNOSIS — H338 Other retinal detachments: Secondary | ICD-10-CM | POA: Diagnosis not present

## 2017-07-28 ENCOUNTER — Encounter (INDEPENDENT_AMBULATORY_CARE_PROVIDER_SITE_OTHER): Payer: Medicare Other | Admitting: Ophthalmology

## 2017-07-28 DIAGNOSIS — H35033 Hypertensive retinopathy, bilateral: Secondary | ICD-10-CM | POA: Diagnosis not present

## 2017-07-28 DIAGNOSIS — I10 Essential (primary) hypertension: Secondary | ICD-10-CM | POA: Diagnosis not present

## 2017-07-28 DIAGNOSIS — H353211 Exudative age-related macular degeneration, right eye, with active choroidal neovascularization: Secondary | ICD-10-CM

## 2017-07-28 DIAGNOSIS — H43813 Vitreous degeneration, bilateral: Secondary | ICD-10-CM | POA: Diagnosis not present

## 2017-07-28 DIAGNOSIS — H353122 Nonexudative age-related macular degeneration, left eye, intermediate dry stage: Secondary | ICD-10-CM

## 2017-07-30 ENCOUNTER — Encounter (INDEPENDENT_AMBULATORY_CARE_PROVIDER_SITE_OTHER): Payer: Medicare Other | Admitting: Ophthalmology

## 2017-09-04 ENCOUNTER — Encounter (INDEPENDENT_AMBULATORY_CARE_PROVIDER_SITE_OTHER): Payer: Medicare Other | Admitting: Ophthalmology

## 2017-09-04 DIAGNOSIS — H43813 Vitreous degeneration, bilateral: Secondary | ICD-10-CM | POA: Diagnosis not present

## 2017-09-04 DIAGNOSIS — H353122 Nonexudative age-related macular degeneration, left eye, intermediate dry stage: Secondary | ICD-10-CM

## 2017-09-04 DIAGNOSIS — H338 Other retinal detachments: Secondary | ICD-10-CM | POA: Diagnosis not present

## 2017-09-04 DIAGNOSIS — H35033 Hypertensive retinopathy, bilateral: Secondary | ICD-10-CM

## 2017-09-04 DIAGNOSIS — H353211 Exudative age-related macular degeneration, right eye, with active choroidal neovascularization: Secondary | ICD-10-CM | POA: Diagnosis not present

## 2017-09-04 DIAGNOSIS — I10 Essential (primary) hypertension: Secondary | ICD-10-CM | POA: Diagnosis not present

## 2017-09-25 ENCOUNTER — Other Ambulatory Visit: Payer: Self-pay | Admitting: Cardiovascular Disease

## 2017-09-30 DIAGNOSIS — H353124 Nonexudative age-related macular degeneration, left eye, advanced atrophic with subfoveal involvement: Secondary | ICD-10-CM | POA: Diagnosis not present

## 2017-09-30 DIAGNOSIS — H25812 Combined forms of age-related cataract, left eye: Secondary | ICD-10-CM | POA: Diagnosis not present

## 2017-09-30 DIAGNOSIS — H353212 Exudative age-related macular degeneration, right eye, with inactive choroidal neovascularization: Secondary | ICD-10-CM | POA: Diagnosis not present

## 2017-10-09 ENCOUNTER — Encounter (INDEPENDENT_AMBULATORY_CARE_PROVIDER_SITE_OTHER): Payer: Medicare Other | Admitting: Ophthalmology

## 2017-10-09 DIAGNOSIS — I10 Essential (primary) hypertension: Secondary | ICD-10-CM | POA: Diagnosis not present

## 2017-10-09 DIAGNOSIS — H43813 Vitreous degeneration, bilateral: Secondary | ICD-10-CM

## 2017-10-09 DIAGNOSIS — H35371 Puckering of macula, right eye: Secondary | ICD-10-CM | POA: Diagnosis not present

## 2017-10-09 DIAGNOSIS — H2512 Age-related nuclear cataract, left eye: Secondary | ICD-10-CM | POA: Diagnosis not present

## 2017-10-09 DIAGNOSIS — H35033 Hypertensive retinopathy, bilateral: Secondary | ICD-10-CM | POA: Diagnosis not present

## 2017-10-09 DIAGNOSIS — H353122 Nonexudative age-related macular degeneration, left eye, intermediate dry stage: Secondary | ICD-10-CM

## 2017-10-09 DIAGNOSIS — H353211 Exudative age-related macular degeneration, right eye, with active choroidal neovascularization: Secondary | ICD-10-CM | POA: Diagnosis not present

## 2017-10-09 DIAGNOSIS — H338 Other retinal detachments: Secondary | ICD-10-CM

## 2017-10-12 ENCOUNTER — Other Ambulatory Visit: Payer: Self-pay | Admitting: Cardiovascular Disease

## 2017-10-15 ENCOUNTER — Other Ambulatory Visit: Payer: Self-pay | Admitting: *Deleted

## 2017-10-15 MED ORDER — APIXABAN 5 MG PO TABS: 5.0000 mg | ORAL_TABLET | Freq: Two times a day (BID) | ORAL | 2 refills | Status: DC

## 2017-10-15 NOTE — Telephone Encounter (Signed)
Pt last saw Dr Acie Fredrickson 10/22/16, has yearly f/u appt scheduled for 11/20/17.  Last labs 11/03/16 Creat 0.8, age 77, weight 87.9kg, based on specified criteria pt is on appropriate dosage of Eliquis 5mg  BID.  Will refill rx x 3 months and await new CBC and BMP following appt with Dr Acie Fredrickson.  Request for CBC/BMP to be done at [facility] on 11/20/17 placed on appt.

## 2017-10-22 ENCOUNTER — Other Ambulatory Visit: Payer: Self-pay | Admitting: Cardiovascular Disease

## 2017-11-03 ENCOUNTER — Encounter: Payer: Self-pay | Admitting: Cardiovascular Disease

## 2017-11-06 DIAGNOSIS — R82998 Other abnormal findings in urine: Secondary | ICD-10-CM | POA: Diagnosis not present

## 2017-11-06 DIAGNOSIS — E038 Other specified hypothyroidism: Secondary | ICD-10-CM | POA: Diagnosis not present

## 2017-11-06 DIAGNOSIS — E1151 Type 2 diabetes mellitus with diabetic peripheral angiopathy without gangrene: Secondary | ICD-10-CM | POA: Diagnosis not present

## 2017-11-06 DIAGNOSIS — E7849 Other hyperlipidemia: Secondary | ICD-10-CM | POA: Diagnosis not present

## 2017-11-06 DIAGNOSIS — Z125 Encounter for screening for malignant neoplasm of prostate: Secondary | ICD-10-CM | POA: Diagnosis not present

## 2017-11-12 ENCOUNTER — Encounter (INDEPENDENT_AMBULATORY_CARE_PROVIDER_SITE_OTHER): Payer: Medicare Other | Admitting: Ophthalmology

## 2017-11-12 DIAGNOSIS — H2512 Age-related nuclear cataract, left eye: Secondary | ICD-10-CM | POA: Diagnosis not present

## 2017-11-12 DIAGNOSIS — H338 Other retinal detachments: Secondary | ICD-10-CM | POA: Diagnosis not present

## 2017-11-12 DIAGNOSIS — H43813 Vitreous degeneration, bilateral: Secondary | ICD-10-CM | POA: Diagnosis not present

## 2017-11-12 DIAGNOSIS — I10 Essential (primary) hypertension: Secondary | ICD-10-CM

## 2017-11-12 DIAGNOSIS — H353122 Nonexudative age-related macular degeneration, left eye, intermediate dry stage: Secondary | ICD-10-CM | POA: Diagnosis not present

## 2017-11-12 DIAGNOSIS — H35371 Puckering of macula, right eye: Secondary | ICD-10-CM | POA: Diagnosis not present

## 2017-11-12 DIAGNOSIS — H35033 Hypertensive retinopathy, bilateral: Secondary | ICD-10-CM

## 2017-11-12 DIAGNOSIS — H353211 Exudative age-related macular degeneration, right eye, with active choroidal neovascularization: Secondary | ICD-10-CM | POA: Diagnosis not present

## 2017-11-17 ENCOUNTER — Other Ambulatory Visit: Payer: Self-pay | Admitting: Cardiovascular Disease

## 2017-11-18 DIAGNOSIS — Z0000 Encounter for general adult medical examination without abnormal findings: Secondary | ICD-10-CM | POA: Diagnosis not present

## 2017-11-18 DIAGNOSIS — E7849 Other hyperlipidemia: Secondary | ICD-10-CM | POA: Diagnosis not present

## 2017-11-18 DIAGNOSIS — N200 Calculus of kidney: Secondary | ICD-10-CM | POA: Diagnosis not present

## 2017-11-18 DIAGNOSIS — I7389 Other specified peripheral vascular diseases: Secondary | ICD-10-CM | POA: Diagnosis not present

## 2017-11-18 DIAGNOSIS — E1151 Type 2 diabetes mellitus with diabetic peripheral angiopathy without gangrene: Secondary | ICD-10-CM | POA: Diagnosis not present

## 2017-11-18 DIAGNOSIS — I482 Chronic atrial fibrillation: Secondary | ICD-10-CM | POA: Diagnosis not present

## 2017-11-18 DIAGNOSIS — Z1389 Encounter for screening for other disorder: Secondary | ICD-10-CM | POA: Diagnosis not present

## 2017-11-18 DIAGNOSIS — E038 Other specified hypothyroidism: Secondary | ICD-10-CM | POA: Diagnosis not present

## 2017-11-18 DIAGNOSIS — Z6825 Body mass index (BMI) 25.0-25.9, adult: Secondary | ICD-10-CM | POA: Diagnosis not present

## 2017-11-18 DIAGNOSIS — Z7901 Long term (current) use of anticoagulants: Secondary | ICD-10-CM | POA: Diagnosis not present

## 2017-11-20 ENCOUNTER — Ambulatory Visit (INDEPENDENT_AMBULATORY_CARE_PROVIDER_SITE_OTHER): Payer: Medicare Other | Admitting: Cardiovascular Disease

## 2017-11-20 ENCOUNTER — Encounter: Payer: Self-pay | Admitting: Cardiovascular Disease

## 2017-11-20 VITALS — BP 126/66 | HR 63 | Ht 74.0 in | Wt 192.8 lb

## 2017-11-20 DIAGNOSIS — I482 Chronic atrial fibrillation, unspecified: Secondary | ICD-10-CM

## 2017-11-20 NOTE — Progress Notes (Signed)
Cardiology Office Note   Date:  11/20/2017   ID:  Craig Simon, DOB Dec 25, 1940, MRN [id number]  PCP:  Leanna Battles, MD  Cardiologist:   Mertie Moores, MD   Chief Complaint  Patient presents with  . Atrial Fibrillation   1. Atrial Fibrillation 2. Hypothyroidism 3. Diabetes mellitus 4. Hyperlipidemia 5. Obstructive sleep apnea- wears CPAP at night   Craig Simon is a 77 year old gentleman with a history of atrial fibrillation. He's done quite well from a cardiac standpoint. He's not had any episodes of chest pain or shortness of breath. He's been able to do all of his normal activities without any problems.  He has noticed some leg edema. He has not been eating much salt.  October 07, 2013:  Craig Simon is doing well. He is able to do all of his normal activities without any significant problems. He denies any chest pain or shortness breath.   October 16, 2014:  Craig Simon is a 77 y.o. male who presents for f/u of his atrial fib   No CP , no dyspnea. Still very active.[name]   October 18, 2015:  Staying very active.    BP and HR are great.   No CP or dyspena Going canoeing this Saturday.   October 22, 2016:    Craig Simon  seen back today for further evaluation of his chronic atrial fibrillation   Under lots of stress .   Nov 20, 2017:  Craig Simon is seen back today for follow up office visit  For his atrial fib  No CP or dyspnea.      Past Medical History:  Diagnosis Date  . Chest pain   . CHF (congestive heart failure) ([place])   . Chronic anticoagulation   . Chronic atrial fibrillation ([place])   . Diabetes mellitus   . Hyperlipidemia   . Hypothyroidism   . Internal hemorrhoids without mention of complication 97/35/3299  . Leg edema   . Macular degeneration   . Mitral regurgitation   . Sleep apnea    has cpap but cannot tolerate    Past Surgical History:  Procedure Laterality Date  . ADENOIDECTOMY  1949  . CARDIAC CATHETERIZATION  11/03/2000   EF 40%  . CARDIOVASCULAR STRESS TEST   09/20/2004   EF 56%. NO EVIDENCE OF ISCHEMIA  . cornea surgery  2009   right eye  . DOPPLER ECHOCARDIOGRAPHY  03/09/2001   EF 50%. MODERATE LVH  . EYE SURGERY  2007   right eye; detached retina  . HAMMER TOE SURGERY  07/20/12   right  . [place]  . US ECHOCARDIOGRAPHY  09/10/2004   EF 55-60%     Current Outpatient Medications  Medication Sig Dispense Refill  . apixaban (ELIQUIS) 5 MG TABS tablet Take 1 tablet (5 mg total) by mouth 2 (two) times daily. 60 tablet 2  . Bevacizumab (AVASTIN) 100 MG/4ML SOLN Inject into the vein.    [name] ezetimibe (ZETIA) 10 MG tablet Take 10 mg by mouth daily.      . fish oil-omega-3 fatty acids 1000 MG capsule Take 2 g by mouth 2 (two) times daily.      . fluticasone (FLONASE) 50 MCG/ACT nasal spray Place 2 sprays into the nose as needed for rhinitis. 1 g 12  . furosemide (LASIX) 40 MG tablet TAKE ONE TABLET BY MOUTH 3 TIMES A WEEK 36 tablet 3  . KLOR-CON M20 20 MEQ tablet TAKE 6 TABLETS BY MOUTH ONCE A WEEK 12 tablet 0  .  levothyroxine (SYNTHROID, LEVOTHROID) 150 MCG tablet Take 150 mcg by mouth daily.      [name] lisinopril (PRINIVIL,ZESTRIL) 5 MG tablet Take 5 mg by mouth daily.    . metFORMIN (GLUCOPHAGE) 500 MG tablet Take 500 mg by mouth at bedtime.      . metoprolol tartrate (LOPRESSOR) 25 MG tablet TAKE 1/2 TABLET BY MOUTH TWICE A DAY 90 tablet 0  . Multiple Vitamin (MULTIVITAMIN) tablet Take 1 tablet by mouth daily.      . multivitamin-lutein (OCUVITE-LUTEIN) CAPS Take 1 capsule by mouth daily.      . pioglitazone (ACTOS) 15 MG tablet Take 15 mg by mouth daily.      . rosuvastatin (CRESTOR) 20 MG tablet Take 20 mg by mouth daily.      . SF 1.1 % GEL dental gel Place 1 application onto teeth at bedtime.      No current facility-administered medications for this visit.     Allergies:   Patient has no known allergies.    Social History:  The patient  reports that he has never smoked. He has never used smokeless tobacco. He reports that  he drinks alcohol. He reports that he does not use drugs.   Family History:  The patient's family history includes Heart disease in his brother, father, and mother.    ROS:   Noted in current history, otherwise review of systems is negative.  Physical Exam: Blood pressure 126/66, pulse 63, height 6\' 2"  (1.88 m), weight 192 lb 12.8 oz (87.5 kg), SpO2 98 %.  GEN:  Well nourished, well developed in no acute distress HEENT: Normal NECK: No JVD; No carotid bruits LYMPHATICS: No lymphadenopathy CARDIAC: irreg. Irreg.  RESPIRATORY:  Clear to auscultation without rales, wheezing or rhonchi  ABDOMEN: Soft, non-tender, non-distended MUSCULOSKELETAL:  No edema; No deformity  SKIN: Warm and dry NEUROLOGIC:  Alert and oriented x 3   EKG:    - Nov 20, 2017:   Atrial fib at 36.   RAD   Recent Labs: No results found for requested labs within last 8760 hours.    Lipid Panel    Component Value Date/Time   CHOL 101 05/07/2011 1117   TRIG 119.0 05/07/2011 1117   HDL 48.10 05/07/2011 1117   CHOLHDL 2 05/07/2011 1117   VLDL 23.8 05/07/2011 1117   LDLCALC 29 05/07/2011 1117      Wt Readings from Last 3 Encounters:  11/20/17 192 lb 12.8 oz (87.5 kg)  10/22/16 193 lb 12.8 oz (87.9 kg)  10/18/15 195 lb 3.2 oz (88.5 kg)      Other studies Reviewed: Additional studies/ records that were reviewed today include: INR levesl . Review of the above records demonstrates: he has been theraputic  For his INR    ASSESSMENT AND PLAN:  1.  Atrial fibrillation:  Craig Simon remains in atrial fib.   Very stable  Will see him in a year .  Labs were drawn at primary MD office and are stable  Current medicines are reviewed at length with the patient today.  The patient does not have concerns regarding medicines.  The following changes have been made:  See above   Labs/ tests ordered today include:  No orders of the defined types were placed in this encounter.    Disposition:   FU with me in 1 year     Signed, Mertie Moores, MD  11/20/2017 1:36 PM    [facility] [place], [place]  [address] Phone: [phone]; Fax: [phone]

## 2017-11-20 NOTE — Patient Instructions (Signed)
Your physician recommends that you continue on your current medications as directed. Please refer to the Current Medication list given to you today.  Your physician wants you to follow-up in: 1 year with Dr. Nahser.  You will receive a reminder letter in the mail two months in advance. If you don't receive a letter, please call our office to schedule the follow-up appointment.  

## 2017-11-25 ENCOUNTER — Other Ambulatory Visit: Payer: Self-pay | Admitting: Cardiovascular Disease

## 2017-11-27 DIAGNOSIS — Z1212 Encounter for screening for malignant neoplasm of rectum: Secondary | ICD-10-CM | POA: Diagnosis not present

## 2017-12-08 ENCOUNTER — Other Ambulatory Visit: Payer: Self-pay | Admitting: Cardiovascular Disease

## 2017-12-17 ENCOUNTER — Encounter (INDEPENDENT_AMBULATORY_CARE_PROVIDER_SITE_OTHER): Payer: Medicare Other | Admitting: Ophthalmology

## 2017-12-17 DIAGNOSIS — I10 Essential (primary) hypertension: Secondary | ICD-10-CM | POA: Diagnosis not present

## 2017-12-17 DIAGNOSIS — H2512 Age-related nuclear cataract, left eye: Secondary | ICD-10-CM | POA: Diagnosis not present

## 2017-12-17 DIAGNOSIS — H43813 Vitreous degeneration, bilateral: Secondary | ICD-10-CM | POA: Diagnosis not present

## 2017-12-17 DIAGNOSIS — H353122 Nonexudative age-related macular degeneration, left eye, intermediate dry stage: Secondary | ICD-10-CM | POA: Diagnosis not present

## 2017-12-17 DIAGNOSIS — H338 Other retinal detachments: Secondary | ICD-10-CM

## 2017-12-17 DIAGNOSIS — H353211 Exudative age-related macular degeneration, right eye, with active choroidal neovascularization: Secondary | ICD-10-CM | POA: Diagnosis not present

## 2017-12-17 DIAGNOSIS — H35033 Hypertensive retinopathy, bilateral: Secondary | ICD-10-CM | POA: Diagnosis not present

## 2018-01-04 ENCOUNTER — Other Ambulatory Visit: Payer: Self-pay | Admitting: Cardiovascular Disease

## 2018-01-11 ENCOUNTER — Other Ambulatory Visit: Payer: Self-pay | Admitting: Cardiovascular Disease

## 2018-01-15 ENCOUNTER — Other Ambulatory Visit: Payer: Self-pay | Admitting: Cardiovascular Disease

## 2018-01-21 ENCOUNTER — Encounter (INDEPENDENT_AMBULATORY_CARE_PROVIDER_SITE_OTHER): Payer: Medicare Other | Admitting: Ophthalmology

## 2018-01-21 DIAGNOSIS — H338 Other retinal detachments: Secondary | ICD-10-CM | POA: Diagnosis not present

## 2018-01-21 DIAGNOSIS — H35033 Hypertensive retinopathy, bilateral: Secondary | ICD-10-CM

## 2018-01-21 DIAGNOSIS — I10 Essential (primary) hypertension: Secondary | ICD-10-CM

## 2018-01-21 DIAGNOSIS — H2512 Age-related nuclear cataract, left eye: Secondary | ICD-10-CM | POA: Diagnosis not present

## 2018-01-21 DIAGNOSIS — H43813 Vitreous degeneration, bilateral: Secondary | ICD-10-CM

## 2018-01-21 DIAGNOSIS — H353122 Nonexudative age-related macular degeneration, left eye, intermediate dry stage: Secondary | ICD-10-CM

## 2018-01-21 DIAGNOSIS — H353211 Exudative age-related macular degeneration, right eye, with active choroidal neovascularization: Secondary | ICD-10-CM

## 2018-02-25 ENCOUNTER — Encounter (INDEPENDENT_AMBULATORY_CARE_PROVIDER_SITE_OTHER): Payer: Medicare Other | Admitting: Ophthalmology

## 2018-02-25 DIAGNOSIS — H4423 Degenerative myopia, bilateral: Secondary | ICD-10-CM

## 2018-02-25 DIAGNOSIS — H353122 Nonexudative age-related macular degeneration, left eye, intermediate dry stage: Secondary | ICD-10-CM

## 2018-02-25 DIAGNOSIS — H35371 Puckering of macula, right eye: Secondary | ICD-10-CM

## 2018-02-25 DIAGNOSIS — H2512 Age-related nuclear cataract, left eye: Secondary | ICD-10-CM | POA: Diagnosis not present

## 2018-02-25 DIAGNOSIS — I10 Essential (primary) hypertension: Secondary | ICD-10-CM | POA: Diagnosis not present

## 2018-02-25 DIAGNOSIS — H43813 Vitreous degeneration, bilateral: Secondary | ICD-10-CM | POA: Diagnosis not present

## 2018-02-25 DIAGNOSIS — H35033 Hypertensive retinopathy, bilateral: Secondary | ICD-10-CM

## 2018-02-25 DIAGNOSIS — H353211 Exudative age-related macular degeneration, right eye, with active choroidal neovascularization: Secondary | ICD-10-CM

## 2018-04-01 ENCOUNTER — Encounter (INDEPENDENT_AMBULATORY_CARE_PROVIDER_SITE_OTHER): Payer: Medicare Other | Admitting: Ophthalmology

## 2018-04-01 DIAGNOSIS — H353122 Nonexudative age-related macular degeneration, left eye, intermediate dry stage: Secondary | ICD-10-CM | POA: Diagnosis not present

## 2018-04-01 DIAGNOSIS — H35033 Hypertensive retinopathy, bilateral: Secondary | ICD-10-CM | POA: Diagnosis not present

## 2018-04-01 DIAGNOSIS — H338 Other retinal detachments: Secondary | ICD-10-CM | POA: Diagnosis not present

## 2018-04-01 DIAGNOSIS — I10 Essential (primary) hypertension: Secondary | ICD-10-CM | POA: Diagnosis not present

## 2018-04-01 DIAGNOSIS — H353211 Exudative age-related macular degeneration, right eye, with active choroidal neovascularization: Secondary | ICD-10-CM

## 2018-04-01 DIAGNOSIS — H43813 Vitreous degeneration, bilateral: Secondary | ICD-10-CM | POA: Diagnosis not present

## 2018-04-03 DIAGNOSIS — Z23 Encounter for immunization: Secondary | ICD-10-CM | POA: Diagnosis not present

## 2018-05-06 ENCOUNTER — Encounter (INDEPENDENT_AMBULATORY_CARE_PROVIDER_SITE_OTHER): Payer: Medicare Other | Admitting: Ophthalmology

## 2018-05-06 DIAGNOSIS — H35033 Hypertensive retinopathy, bilateral: Secondary | ICD-10-CM

## 2018-05-06 DIAGNOSIS — H43813 Vitreous degeneration, bilateral: Secondary | ICD-10-CM | POA: Diagnosis not present

## 2018-05-06 DIAGNOSIS — H4423 Degenerative myopia, bilateral: Secondary | ICD-10-CM | POA: Diagnosis not present

## 2018-05-06 DIAGNOSIS — H353122 Nonexudative age-related macular degeneration, left eye, intermediate dry stage: Secondary | ICD-10-CM

## 2018-05-06 DIAGNOSIS — H353211 Exudative age-related macular degeneration, right eye, with active choroidal neovascularization: Secondary | ICD-10-CM

## 2018-05-06 DIAGNOSIS — H35371 Puckering of macula, right eye: Secondary | ICD-10-CM

## 2018-05-06 DIAGNOSIS — I10 Essential (primary) hypertension: Secondary | ICD-10-CM | POA: Diagnosis not present

## 2018-05-17 DIAGNOSIS — I482 Chronic atrial fibrillation, unspecified: Secondary | ICD-10-CM | POA: Diagnosis not present

## 2018-05-17 DIAGNOSIS — E7849 Other hyperlipidemia: Secondary | ICD-10-CM | POA: Diagnosis not present

## 2018-05-17 DIAGNOSIS — I7389 Other specified peripheral vascular diseases: Secondary | ICD-10-CM | POA: Diagnosis not present

## 2018-05-17 DIAGNOSIS — E038 Other specified hypothyroidism: Secondary | ICD-10-CM | POA: Diagnosis not present

## 2018-05-17 DIAGNOSIS — Z6825 Body mass index (BMI) 25.0-25.9, adult: Secondary | ICD-10-CM | POA: Diagnosis not present

## 2018-05-17 DIAGNOSIS — E1151 Type 2 diabetes mellitus with diabetic peripheral angiopathy without gangrene: Secondary | ICD-10-CM | POA: Diagnosis not present

## 2018-06-08 ENCOUNTER — Encounter (INDEPENDENT_AMBULATORY_CARE_PROVIDER_SITE_OTHER): Payer: Medicare Other | Admitting: Ophthalmology

## 2018-06-08 DIAGNOSIS — H4423 Degenerative myopia, bilateral: Secondary | ICD-10-CM

## 2018-06-08 DIAGNOSIS — H2512 Age-related nuclear cataract, left eye: Secondary | ICD-10-CM

## 2018-06-08 DIAGNOSIS — H353211 Exudative age-related macular degeneration, right eye, with active choroidal neovascularization: Secondary | ICD-10-CM | POA: Diagnosis not present

## 2018-06-08 DIAGNOSIS — H338 Other retinal detachments: Secondary | ICD-10-CM | POA: Diagnosis not present

## 2018-06-08 DIAGNOSIS — I10 Essential (primary) hypertension: Secondary | ICD-10-CM | POA: Diagnosis not present

## 2018-06-08 DIAGNOSIS — H353122 Nonexudative age-related macular degeneration, left eye, intermediate dry stage: Secondary | ICD-10-CM

## 2018-06-08 DIAGNOSIS — H35033 Hypertensive retinopathy, bilateral: Secondary | ICD-10-CM

## 2018-06-08 DIAGNOSIS — H43813 Vitreous degeneration, bilateral: Secondary | ICD-10-CM | POA: Diagnosis not present

## 2018-06-25 ENCOUNTER — Other Ambulatory Visit: Payer: Self-pay | Admitting: Cardiovascular Disease

## 2018-06-25 NOTE — Telephone Encounter (Signed)
Pt last saw Dr Acie Fredrickson 11/20/17, last labs 11/06/17 Creat 0.8, age 77, 87.5kg, based on specified criteria pt is on appropriate dosage of Eliquis 5mg  BID.  Will refill rx.

## 2018-07-13 ENCOUNTER — Encounter (INDEPENDENT_AMBULATORY_CARE_PROVIDER_SITE_OTHER): Payer: Medicare Other | Admitting: Ophthalmology

## 2018-07-13 DIAGNOSIS — H2512 Age-related nuclear cataract, left eye: Secondary | ICD-10-CM

## 2018-07-13 DIAGNOSIS — H353211 Exudative age-related macular degeneration, right eye, with active choroidal neovascularization: Secondary | ICD-10-CM

## 2018-07-13 DIAGNOSIS — H43813 Vitreous degeneration, bilateral: Secondary | ICD-10-CM

## 2018-07-13 DIAGNOSIS — H35033 Hypertensive retinopathy, bilateral: Secondary | ICD-10-CM | POA: Diagnosis not present

## 2018-07-13 DIAGNOSIS — H4423 Degenerative myopia, bilateral: Secondary | ICD-10-CM

## 2018-07-13 DIAGNOSIS — H353122 Nonexudative age-related macular degeneration, left eye, intermediate dry stage: Secondary | ICD-10-CM

## 2018-07-13 DIAGNOSIS — H338 Other retinal detachments: Secondary | ICD-10-CM

## 2018-07-13 DIAGNOSIS — I10 Essential (primary) hypertension: Secondary | ICD-10-CM

## 2018-07-22 ENCOUNTER — Telehealth: Payer: Self-pay | Admitting: Nurse Practitioner

## 2018-07-22 NOTE — Telephone Encounter (Signed)
Received letter from patient regarding cost of Eliquis being too high and requests to switch to Xarelto, Pradaxa, or Savaysa. I called patient and left message asking for information about whether the medication is no longer preferred by insurance or is the high cost part of his copay. I asked him to call back with this information.

## 2018-08-17 ENCOUNTER — Encounter (INDEPENDENT_AMBULATORY_CARE_PROVIDER_SITE_OTHER): Payer: Medicare Other | Admitting: Ophthalmology

## 2018-08-17 DIAGNOSIS — H353122 Nonexudative age-related macular degeneration, left eye, intermediate dry stage: Secondary | ICD-10-CM

## 2018-08-17 DIAGNOSIS — H4423 Degenerative myopia, bilateral: Secondary | ICD-10-CM

## 2018-08-17 DIAGNOSIS — H353211 Exudative age-related macular degeneration, right eye, with active choroidal neovascularization: Secondary | ICD-10-CM

## 2018-08-17 DIAGNOSIS — H338 Other retinal detachments: Secondary | ICD-10-CM

## 2018-08-17 DIAGNOSIS — I10 Essential (primary) hypertension: Secondary | ICD-10-CM

## 2018-08-17 DIAGNOSIS — H43813 Vitreous degeneration, bilateral: Secondary | ICD-10-CM

## 2018-08-17 DIAGNOSIS — H35033 Hypertensive retinopathy, bilateral: Secondary | ICD-10-CM

## 2018-09-21 ENCOUNTER — Other Ambulatory Visit: Payer: Self-pay

## 2018-09-21 ENCOUNTER — Encounter (INDEPENDENT_AMBULATORY_CARE_PROVIDER_SITE_OTHER): Payer: Medicare Other | Admitting: Ophthalmology

## 2018-09-21 DIAGNOSIS — I10 Essential (primary) hypertension: Secondary | ICD-10-CM | POA: Diagnosis not present

## 2018-09-21 DIAGNOSIS — H338 Other retinal detachments: Secondary | ICD-10-CM | POA: Diagnosis not present

## 2018-09-21 DIAGNOSIS — H353211 Exudative age-related macular degeneration, right eye, with active choroidal neovascularization: Secondary | ICD-10-CM | POA: Diagnosis not present

## 2018-09-21 DIAGNOSIS — H4423 Degenerative myopia, bilateral: Secondary | ICD-10-CM | POA: Diagnosis not present

## 2018-09-21 DIAGNOSIS — H43813 Vitreous degeneration, bilateral: Secondary | ICD-10-CM | POA: Diagnosis not present

## 2018-09-21 DIAGNOSIS — H35033 Hypertensive retinopathy, bilateral: Secondary | ICD-10-CM | POA: Diagnosis not present

## 2018-09-21 DIAGNOSIS — H353122 Nonexudative age-related macular degeneration, left eye, intermediate dry stage: Secondary | ICD-10-CM

## 2018-10-17 ENCOUNTER — Other Ambulatory Visit: Payer: Self-pay | Admitting: Cardiovascular Disease

## 2018-10-26 ENCOUNTER — Encounter (INDEPENDENT_AMBULATORY_CARE_PROVIDER_SITE_OTHER): Payer: Medicare Other | Admitting: Ophthalmology

## 2018-10-26 ENCOUNTER — Other Ambulatory Visit: Payer: Self-pay

## 2018-10-26 DIAGNOSIS — H4423 Degenerative myopia, bilateral: Secondary | ICD-10-CM | POA: Diagnosis not present

## 2018-10-26 DIAGNOSIS — H35033 Hypertensive retinopathy, bilateral: Secondary | ICD-10-CM | POA: Diagnosis not present

## 2018-10-26 DIAGNOSIS — H353211 Exudative age-related macular degeneration, right eye, with active choroidal neovascularization: Secondary | ICD-10-CM | POA: Diagnosis not present

## 2018-10-26 DIAGNOSIS — I10 Essential (primary) hypertension: Secondary | ICD-10-CM | POA: Diagnosis not present

## 2018-10-26 DIAGNOSIS — H2513 Age-related nuclear cataract, bilateral: Secondary | ICD-10-CM | POA: Diagnosis not present

## 2018-10-26 DIAGNOSIS — H338 Other retinal detachments: Secondary | ICD-10-CM | POA: Diagnosis not present

## 2018-10-26 DIAGNOSIS — H43813 Vitreous degeneration, bilateral: Secondary | ICD-10-CM | POA: Diagnosis not present

## 2018-10-26 DIAGNOSIS — H353122 Nonexudative age-related macular degeneration, left eye, intermediate dry stage: Secondary | ICD-10-CM

## 2018-11-16 ENCOUNTER — Telehealth: Payer: Self-pay | Admitting: Nurse Practitioner

## 2018-11-16 DIAGNOSIS — Z125 Encounter for screening for malignant neoplasm of prostate: Secondary | ICD-10-CM | POA: Diagnosis not present

## 2018-11-16 DIAGNOSIS — E038 Other specified hypothyroidism: Secondary | ICD-10-CM | POA: Diagnosis not present

## 2018-11-16 DIAGNOSIS — E7849 Other hyperlipidemia: Secondary | ICD-10-CM | POA: Diagnosis not present

## 2018-11-16 DIAGNOSIS — R82998 Other abnormal findings in urine: Secondary | ICD-10-CM | POA: Diagnosis not present

## 2018-11-16 DIAGNOSIS — E119 Type 2 diabetes mellitus without complications: Secondary | ICD-10-CM | POA: Diagnosis not present

## 2018-11-16 NOTE — Telephone Encounter (Signed)
Left message for patient to call back regarding converting to a virtual visit on 5/21

## 2018-11-22 ENCOUNTER — Ambulatory Visit: Payer: Medicare Other | Admitting: Cardiovascular Disease

## 2018-11-22 NOTE — Telephone Encounter (Signed)
Attempted to contact pt on both home and mobile number. Left message for pt to call back about upcoming appt and his preference of video or phone call for his appt.

## 2018-11-23 ENCOUNTER — Other Ambulatory Visit: Payer: Self-pay

## 2018-11-23 ENCOUNTER — Encounter (INDEPENDENT_AMBULATORY_CARE_PROVIDER_SITE_OTHER): Payer: Medicare Other | Admitting: Ophthalmology

## 2018-11-23 DIAGNOSIS — H35033 Hypertensive retinopathy, bilateral: Secondary | ICD-10-CM

## 2018-11-23 DIAGNOSIS — H4423 Degenerative myopia, bilateral: Secondary | ICD-10-CM | POA: Diagnosis not present

## 2018-11-23 DIAGNOSIS — H338 Other retinal detachments: Secondary | ICD-10-CM | POA: Diagnosis not present

## 2018-11-23 DIAGNOSIS — H353122 Nonexudative age-related macular degeneration, left eye, intermediate dry stage: Secondary | ICD-10-CM

## 2018-11-23 DIAGNOSIS — I739 Peripheral vascular disease, unspecified: Secondary | ICD-10-CM | POA: Diagnosis not present

## 2018-11-23 DIAGNOSIS — E785 Hyperlipidemia, unspecified: Secondary | ICD-10-CM | POA: Diagnosis not present

## 2018-11-23 DIAGNOSIS — Z7901 Long term (current) use of anticoagulants: Secondary | ICD-10-CM | POA: Diagnosis not present

## 2018-11-23 DIAGNOSIS — H43813 Vitreous degeneration, bilateral: Secondary | ICD-10-CM

## 2018-11-23 DIAGNOSIS — N200 Calculus of kidney: Secondary | ICD-10-CM | POA: Diagnosis not present

## 2018-11-23 DIAGNOSIS — H353211 Exudative age-related macular degeneration, right eye, with active choroidal neovascularization: Secondary | ICD-10-CM

## 2018-11-23 DIAGNOSIS — I482 Chronic atrial fibrillation, unspecified: Secondary | ICD-10-CM | POA: Diagnosis not present

## 2018-11-23 DIAGNOSIS — H3530 Unspecified macular degeneration: Secondary | ICD-10-CM | POA: Diagnosis not present

## 2018-11-23 DIAGNOSIS — Z0000 Encounter for general adult medical examination without abnormal findings: Secondary | ICD-10-CM | POA: Diagnosis not present

## 2018-11-23 DIAGNOSIS — I10 Essential (primary) hypertension: Secondary | ICD-10-CM | POA: Diagnosis not present

## 2018-11-23 DIAGNOSIS — E039 Hypothyroidism, unspecified: Secondary | ICD-10-CM | POA: Diagnosis not present

## 2018-11-23 DIAGNOSIS — Z1331 Encounter for screening for depression: Secondary | ICD-10-CM | POA: Diagnosis not present

## 2018-11-23 DIAGNOSIS — E1151 Type 2 diabetes mellitus with diabetic peripheral angiopathy without gangrene: Secondary | ICD-10-CM | POA: Diagnosis not present

## 2018-11-23 NOTE — Telephone Encounter (Signed)
YOUR CARDIOLOGY TEAM HAS ARRANGED FOR AN E-VISIT FOR YOUR APPOINTMENT - PLEASE REVIEW IMPORTANT INFORMATION BELOW SEVERAL DAYS PRIOR TO YOUR APPOINTMENT  Due to the recent COVID-19 pandemic, we are transitioning in-person office visits to tele-medicine visits in an effort to decrease unnecessary exposure to our patients, their families, and staff. These visits are billed to your insurance just like a normal visit is. We also encourage you to sign up for MyChart if you have not already done so. You will need a smartphone if possible. For patients that do not have this, we can still complete the visit using a regular telephone but do prefer a smartphone to enable video when possible. You may have a family member that lives with you that can help. If possible, we also ask that you have a blood pressure cuff and scale at home to measure your blood pressure, heart rate and weight prior to your scheduled appointment. Patients with clinical needs that need an in-person evaluation and testing will still be able to come to the office if absolutely necessary. If you have any questions, feel free to call our office.     YOUR PROVIDER WILL BE USING THE FOLLOWING PLATFORM TO COMPLETE YOUR VISIT: Doxy.Me  . IF USING DOXIMITY or DOXY.ME - The staff will give you instructions on receiving your link to join the meeting the day of your visit.     2-3 DAYS BEFORE YOUR APPOINTMENT  You will receive a telephone call from one of our [place] team members - your caller ID may say "Unknown caller." If this is a video visit, we will walk you through how to get the video launched on your phone. We will remind you check your blood pressure, heart rate and weight prior to your scheduled appointment. If you have an Apple Watch or Kardia, please upload any pertinent ECG strips the day before or morning of your appointment to [facility]. Our staff will also make sure you have reviewed the consent and agree to move forward with your  scheduled tele-health visit.    THE DAY OF YOUR APPOINTMENT  Approximately 15 minutes prior to your scheduled appointment, you will receive a telephone call from one of [facility] team - your caller ID may say "Unknown caller."  Our staff will confirm medications, vital signs for the day and any symptoms you may be experiencing. Please have this information available prior to the time of visit start. It may also be helpful for you to have a pad of paper and pen handy for any instructions given during your visit. They will also walk you through joining the smartphone meeting if this is a video visit.  CONSENT FOR TELE-HEALTH VISIT - PLEASE REVIEW  I hereby voluntarily request, consent and authorize [facility] and its employed or contracted physicians, physician assistants, nurse practitioners or other licensed health care professionals (the Practitioner), to provide me with telemedicine health care services (the "Services") as deemed necessary by the treating Practitioner. I acknowledge and consent to receive the Services by the Practitioner via telemedicine. I understand that the telemedicine visit will involve communicating with the Practitioner through live audiovisual communication technology and the disclosure of certain medical information by electronic transmission. I acknowledge that I have been given the opportunity to request an in-person assessment or other available alternative prior to the telemedicine visit and am voluntarily participating in the telemedicine visit.  I understand that I have the right to withhold or withdraw my consent to the use of telemedicine in  the course of my care at any time, without affecting my right to future care or treatment, and that the Practitioner or I may terminate the telemedicine visit at any time. I understand that I have the right to inspect all information obtained and/or recorded in the course of the telemedicine visit and may receive copies of  available information for a reasonable fee.  I understand that some of the potential risks of receiving the Services via telemedicine include:  [name] Delay or interruption in medical evaluation due to technological equipment failure or disruption; . Information transmitted may not be sufficient (e.g. poor resolution of images) to allow for appropriate medical decision making by the Practitioner; and/or  . In rare instances, security protocols could fail, causing a breach of personal health information.  Furthermore, I acknowledge that it is my responsibility to provide information about my medical history, conditions and care that is complete and accurate to the best of my ability. I acknowledge that Practitioner's advice, recommendations, and/or decision may be based on factors not within their control, such as incomplete or inaccurate data provided by me or distortions of diagnostic images or specimens that may result from electronic transmissions. I understand that the practice of medicine is not an exact science and that Practitioner makes no warranties or guarantees regarding treatment outcomes. I acknowledge that I will receive a copy of this consent concurrently upon execution via email to the email address I last provided but may also request a printed copy by calling the office of [facility].    I understand that my insurance will be billed for this visit.   I have read or had this consent read to me. . I understand the contents of this consent, which adequately explains the benefits and risks of the Services being provided via telemedicine.  . I have been provided ample opportunity to ask questions regarding this consent and the Services and have had my questions answered to my satisfaction. . I give my informed consent for the services to be provided through the use of telemedicine in my medical care  By participating in this telemedicine visit I agree to the above.

## 2018-11-24 NOTE — Progress Notes (Signed)
Virtual Visit via Telephone Note   This visit type was conducted due to national recommendations for restrictions regarding the COVID-19 Pandemic (e.g. social distancing) in an effort to limit this patient's exposure and mitigate transmission in our community.  Due to his co-morbid illnesses, this patient is at least at moderate risk for complications without adequate follow up.  This format is felt to be most appropriate for this patient at this time.  The patient did not have access to video technology/had technical difficulties with video requiring transitioning to audio format only (telephone).  All issues noted in this document were discussed and addressed.  No physical exam could be performed with this format.  Please refer to the patient's chart for his  consent to telehealth for [facility].   Date:  11/25/2018   ID:  Craig Simon, DOB Mar 01, 1941, MRN [id number]  Patient Location: Home Provider Location: Office  PCP:  Leanna Battles, MD  Cardiologist:  Mertie Moores, MD  Electrophysiologist:  None   Evaluation Performed:  Follow-Up Visit  Problem List 1. Atrial Fibrillation 2. Hypothyroidism 3. Diabetes mellitus 4. Hyperlipidemia 5. Obstructive sleep apnea- wears CPAP at night   Craig Simon is a 78 y.o.  gentleman with a history of atrial fibrillation. He's done quite well from a cardiac standpoint. He's not had any episodes of chest pain or shortness of breath. He's been able to do all of his normal activities without any problems.  He has noticed some leg edema. He has not been eating much salt.  October 07, 2013:  Craig Simon is doing well. He is able to do all of his normal activities without any significant problems. He denies any chest pain or shortness breath.   October 16, 2014:  Craig Simon is a 78 y.o. . male who presents for f/u of his atrial fib   No CP , no dyspnea. Still very active.[name]   October 18, 2015:  Staying very active.    BP and HR are great.    No CP or dyspena Going canoeing this Saturday.   October 22, 2016:    Craig Simon  seen back today for further evaluation of his chronic atrial fibrillation   Under lots of stress .   Nov 20, 2017:  Craig Simon is seen back today for follow up office visit  For his atrial fib  No CP or dyspnea.       Chief Complaint:  Atrial fib   Nov 24, 2018     Craig Simon is a 78 y.o. male with hx of atrial fib, hyperlipidemia.   Doing well.   recent Lipids at primary MD  Chol = 113 HDL = 48 LDL = 52 Trigs = 66  Wearing mask.   stayig socially distanced    The patient does not have symptoms concerning for COVID-19 infection (fever, chills, cough, or new shortness of breath).    Past Medical History:  Diagnosis Date  . Chest pain   . CHF (congestive heart failure) ([place])   . Chronic anticoagulation   . Chronic atrial fibrillation   . Diabetes mellitus   . Hyperlipidemia   . Hypothyroidism   . Internal hemorrhoids without mention of complication 85/46/2703  . Leg edema   . Macular degeneration   . Mitral regurgitation   . Sleep apnea    has cpap but cannot tolerate   Past Surgical History:  Procedure Laterality Date  . ADENOIDECTOMY  1949  . CARDIAC CATHETERIZATION  11/03/2000   EF 40%  .  CARDIOVASCULAR STRESS TEST  09/20/2004   EF 56%. NO EVIDENCE OF ISCHEMIA  . cornea surgery  2009   right eye  . DOPPLER ECHOCARDIOGRAPHY  03/09/2001   EF 50%. MODERATE LVH  . EYE SURGERY  2007   right eye; detached retina  . HAMMER TOE SURGERY  07/20/12   right  . [place]  . US ECHOCARDIOGRAPHY  09/10/2004   EF 55-60%     Current Meds  Medication Sig  . Bevacizumab (AVASTIN) 100 MG/4ML SOLN Inject into the vein daily.   [name] ELIQUIS 5 MG TABS tablet TAKE 1 TABLET BY MOUTH TWICE A DAY  . ezetimibe (ZETIA) 10 MG tablet Take 10 mg by mouth daily.    . fish oil-omega-3 fatty acids 1000 MG capsule Take 2 g by mouth 2 (two) times daily.    . fluticasone (FLONASE) 50 MCG/ACT  nasal spray Place 2 sprays into the nose as needed for rhinitis.  . furosemide (LASIX) 40 MG tablet TAKE ONE TABLET BY MOUTH 3 TIMES A WEEK  . KLOR-CON M20 20 MEQ tablet TAKE 6 TABLETS BY MOUTH ONCE A WEEK  . levothyroxine (SYNTHROID, LEVOTHROID) 150 MCG tablet Take 150 mcg by mouth daily.    [name] lisinopril (PRINIVIL,ZESTRIL) 5 MG tablet TAKE 1 TABLET (5 MG TOTAL) BY MOUTH DAILY.  . metFORMIN (GLUCOPHAGE) 500 MG tablet Take 500 mg by mouth at bedtime.    . metoprolol tartrate (LOPRESSOR) 25 MG tablet TAKE 1/2 TABLET BY MOUTH TWICE A DAY  . Multiple Vitamin (MULTIVITAMIN) tablet Take 1 tablet by mouth daily.    . multivitamin-lutein (OCUVITE-LUTEIN) CAPS Take 1 capsule by mouth daily.    . pioglitazone (ACTOS) 15 MG tablet Take 15 mg by mouth daily.    . rosuvastatin (CRESTOR) 20 MG tablet Take 20 mg by mouth daily.    . SF 1.1 % GEL dental gel Place 1 application onto teeth at bedtime.      Allergies:   Patient has no known allergies.   Social History   Tobacco Use  . Smoking status: Never Smoker  . Smokeless tobacco: Never Used  Substance Use Topics  . Alcohol use: Yes    Comment: 1 glass a night   . Drug use: No     Family Hx: The patient's family history includes Heart disease in his brother, father, and mother. There is no history of Colon cancer, Stomach cancer, Rectal cancer, or Esophageal cancer.  ROS:   Please see the history of present illness.     All other systems reviewed and are negative.   Prior CV studies:   The following studies were reviewed today:    Labs/Other Tests and Data Reviewed:    EKG:  No ECG reviewed.  Recent Labs: No results found for requested labs within last 8760 hours.   Recent Lipid Panel Lab Results  Component Value Date/Time   CHOL 101 05/07/2011 11:17 AM   TRIG 119.0 05/07/2011 11:17 AM   HDL 48.10 05/07/2011 11:17 AM   CHOLHDL 2 05/07/2011 11:17 AM   LDLCALC 29 05/07/2011 11:17 AM    Wt Readings from Last 3 Encounters:   11/25/18 186 lb (84.4 kg)  11/20/17 192 lb 12.8 oz (87.5 kg)  10/22/16 193 lb 12.8 oz (87.9 kg)     Objective:    Vital Signs:  BP 125/68 (BP Location: Right Arm, Patient Position: Sitting, Cuff Size: Normal)   Ht 6\' 2"  (1.88 m)   Wt 186 lb (84.4 kg)  BMI 23.88 kg/m    No exam - tele phone meeting   ASSESSMENT & PLAN:    1. Atrial fib:  Doing well.  On eliquis .  HR is well controlled.   2.   Hyperlipidemia:   Continue crestor .  Lipids look good    3.  DM  :  Plans per primary MD   COVID-19 Education: The signs and symptoms of COVID-19 were discussed with the patient and how to seek care for testing (follow up with PCP or arrange E-visit).  The importance of social distancing was discussed today.  Time:   Today, I have spent  16  minutes with the patient with telehealth technology discussing the above problems.     Medication Adjustments/Labs and Tests Ordered: Current medicines are reviewed at length with the patient today.  Concerns regarding medicines are outlined above.   Tests Ordered: No orders of the defined types were placed in this encounter.   Medication Changes: No orders of the defined types were placed in this encounter.   Disposition:  Follow up in 1 year(s)  Signed, Mertie Moores, MD  11/25/2018 5:28 PM    [place]

## 2018-11-25 ENCOUNTER — Encounter: Payer: Self-pay | Admitting: Cardiovascular Disease

## 2018-11-25 ENCOUNTER — Telehealth (INDEPENDENT_AMBULATORY_CARE_PROVIDER_SITE_OTHER): Payer: Medicare Other | Admitting: Cardiovascular Disease

## 2018-11-25 ENCOUNTER — Telehealth: Payer: Self-pay | Admitting: Cardiovascular Disease

## 2018-11-25 ENCOUNTER — Other Ambulatory Visit: Payer: Self-pay

## 2018-11-25 VITALS — BP 125/68 | Ht 74.0 in | Wt 186.0 lb

## 2018-11-25 DIAGNOSIS — Z7189 Other specified counseling: Secondary | ICD-10-CM | POA: Diagnosis not present

## 2018-11-25 DIAGNOSIS — E782 Mixed hyperlipidemia: Secondary | ICD-10-CM

## 2018-11-25 DIAGNOSIS — I482 Chronic atrial fibrillation, unspecified: Secondary | ICD-10-CM | POA: Diagnosis not present

## 2018-11-25 NOTE — Telephone Encounter (Signed)
New Message           Patient is calling to let then know that the link for the Virtual visit did not work he would like a call back concerning this matter.

## 2018-11-25 NOTE — Telephone Encounter (Signed)
Encounter not needed

## 2018-11-25 NOTE — Telephone Encounter (Signed)
Patient's virtual visit with Dr. Acie Fredrickson was completed

## 2018-11-25 NOTE — Patient Instructions (Signed)

## 2018-11-28 ENCOUNTER — Other Ambulatory Visit: Payer: Self-pay | Admitting: Cardiovascular Disease

## 2018-12-09 ENCOUNTER — Other Ambulatory Visit: Payer: Self-pay | Admitting: Cardiovascular Disease

## 2018-12-21 ENCOUNTER — Other Ambulatory Visit: Payer: Self-pay

## 2018-12-21 ENCOUNTER — Encounter (INDEPENDENT_AMBULATORY_CARE_PROVIDER_SITE_OTHER): Payer: Medicare Other | Admitting: Ophthalmology

## 2018-12-21 DIAGNOSIS — H4423 Degenerative myopia, bilateral: Secondary | ICD-10-CM | POA: Diagnosis not present

## 2018-12-21 DIAGNOSIS — H353122 Nonexudative age-related macular degeneration, left eye, intermediate dry stage: Secondary | ICD-10-CM

## 2018-12-21 DIAGNOSIS — H353211 Exudative age-related macular degeneration, right eye, with active choroidal neovascularization: Secondary | ICD-10-CM

## 2018-12-21 DIAGNOSIS — H43813 Vitreous degeneration, bilateral: Secondary | ICD-10-CM

## 2018-12-21 DIAGNOSIS — I10 Essential (primary) hypertension: Secondary | ICD-10-CM | POA: Diagnosis not present

## 2018-12-21 DIAGNOSIS — H2512 Age-related nuclear cataract, left eye: Secondary | ICD-10-CM | POA: Diagnosis not present

## 2018-12-21 DIAGNOSIS — H338 Other retinal detachments: Secondary | ICD-10-CM | POA: Diagnosis not present

## 2018-12-21 DIAGNOSIS — H35033 Hypertensive retinopathy, bilateral: Secondary | ICD-10-CM | POA: Diagnosis not present

## 2018-12-27 ENCOUNTER — Other Ambulatory Visit: Payer: Self-pay | Admitting: Cardiovascular Disease

## 2018-12-27 NOTE — Telephone Encounter (Signed)
Pt last saw Dr Acie Fredrickson 11/25/18 telemedicine Covid-19, last labs 11/16/18 Creat 0.8, age 78, weight 87.5kg, based on specified criteria pt is on appropriate dosage of Eliquis 5mg  BID.  Will refill rx.

## 2019-01-18 ENCOUNTER — Encounter (INDEPENDENT_AMBULATORY_CARE_PROVIDER_SITE_OTHER): Payer: Medicare Other | Admitting: Ophthalmology

## 2019-01-18 ENCOUNTER — Other Ambulatory Visit: Payer: Self-pay

## 2019-01-18 DIAGNOSIS — H35033 Hypertensive retinopathy, bilateral: Secondary | ICD-10-CM | POA: Diagnosis not present

## 2019-01-18 DIAGNOSIS — H43813 Vitreous degeneration, bilateral: Secondary | ICD-10-CM | POA: Diagnosis not present

## 2019-01-18 DIAGNOSIS — H353211 Exudative age-related macular degeneration, right eye, with active choroidal neovascularization: Secondary | ICD-10-CM | POA: Diagnosis not present

## 2019-01-18 DIAGNOSIS — I10 Essential (primary) hypertension: Secondary | ICD-10-CM | POA: Diagnosis not present

## 2019-01-18 DIAGNOSIS — H353122 Nonexudative age-related macular degeneration, left eye, intermediate dry stage: Secondary | ICD-10-CM

## 2019-01-18 DIAGNOSIS — H338 Other retinal detachments: Secondary | ICD-10-CM

## 2019-01-18 DIAGNOSIS — H4423 Degenerative myopia, bilateral: Secondary | ICD-10-CM

## 2019-02-15 ENCOUNTER — Other Ambulatory Visit: Payer: Self-pay

## 2019-02-15 ENCOUNTER — Encounter (INDEPENDENT_AMBULATORY_CARE_PROVIDER_SITE_OTHER): Payer: Medicare Other | Admitting: Ophthalmology

## 2019-02-15 DIAGNOSIS — H2512 Age-related nuclear cataract, left eye: Secondary | ICD-10-CM | POA: Diagnosis not present

## 2019-02-15 DIAGNOSIS — H43813 Vitreous degeneration, bilateral: Secondary | ICD-10-CM

## 2019-02-15 DIAGNOSIS — H35033 Hypertensive retinopathy, bilateral: Secondary | ICD-10-CM | POA: Diagnosis not present

## 2019-02-15 DIAGNOSIS — H353211 Exudative age-related macular degeneration, right eye, with active choroidal neovascularization: Secondary | ICD-10-CM

## 2019-02-15 DIAGNOSIS — H353122 Nonexudative age-related macular degeneration, left eye, intermediate dry stage: Secondary | ICD-10-CM

## 2019-02-15 DIAGNOSIS — H4423 Degenerative myopia, bilateral: Secondary | ICD-10-CM

## 2019-02-15 DIAGNOSIS — I10 Essential (primary) hypertension: Secondary | ICD-10-CM | POA: Diagnosis not present

## 2019-02-15 DIAGNOSIS — H338 Other retinal detachments: Secondary | ICD-10-CM

## 2019-03-22 ENCOUNTER — Encounter (INDEPENDENT_AMBULATORY_CARE_PROVIDER_SITE_OTHER): Payer: Medicare Other | Admitting: Ophthalmology

## 2019-03-22 ENCOUNTER — Other Ambulatory Visit: Payer: Self-pay

## 2019-03-22 DIAGNOSIS — I10 Essential (primary) hypertension: Secondary | ICD-10-CM | POA: Diagnosis not present

## 2019-03-22 DIAGNOSIS — H35033 Hypertensive retinopathy, bilateral: Secondary | ICD-10-CM

## 2019-03-22 DIAGNOSIS — H4423 Degenerative myopia, bilateral: Secondary | ICD-10-CM

## 2019-03-22 DIAGNOSIS — H338 Other retinal detachments: Secondary | ICD-10-CM

## 2019-03-22 DIAGNOSIS — H43813 Vitreous degeneration, bilateral: Secondary | ICD-10-CM

## 2019-03-22 DIAGNOSIS — H353122 Nonexudative age-related macular degeneration, left eye, intermediate dry stage: Secondary | ICD-10-CM

## 2019-03-22 DIAGNOSIS — H353211 Exudative age-related macular degeneration, right eye, with active choroidal neovascularization: Secondary | ICD-10-CM | POA: Diagnosis not present

## 2019-03-22 DIAGNOSIS — H2512 Age-related nuclear cataract, left eye: Secondary | ICD-10-CM | POA: Diagnosis not present

## 2019-04-09 DIAGNOSIS — Z23 Encounter for immunization: Secondary | ICD-10-CM | POA: Diagnosis not present

## 2019-04-26 ENCOUNTER — Encounter (INDEPENDENT_AMBULATORY_CARE_PROVIDER_SITE_OTHER): Payer: Medicare Other | Admitting: Ophthalmology

## 2019-04-26 DIAGNOSIS — H4423 Degenerative myopia, bilateral: Secondary | ICD-10-CM

## 2019-04-26 DIAGNOSIS — H2512 Age-related nuclear cataract, left eye: Secondary | ICD-10-CM | POA: Diagnosis not present

## 2019-04-26 DIAGNOSIS — I10 Essential (primary) hypertension: Secondary | ICD-10-CM

## 2019-04-26 DIAGNOSIS — H353211 Exudative age-related macular degeneration, right eye, with active choroidal neovascularization: Secondary | ICD-10-CM | POA: Diagnosis not present

## 2019-04-26 DIAGNOSIS — H353122 Nonexudative age-related macular degeneration, left eye, intermediate dry stage: Secondary | ICD-10-CM | POA: Diagnosis not present

## 2019-04-26 DIAGNOSIS — H43813 Vitreous degeneration, bilateral: Secondary | ICD-10-CM

## 2019-04-26 DIAGNOSIS — H338 Other retinal detachments: Secondary | ICD-10-CM

## 2019-04-26 DIAGNOSIS — H35033 Hypertensive retinopathy, bilateral: Secondary | ICD-10-CM | POA: Diagnosis not present

## 2019-05-31 ENCOUNTER — Encounter (INDEPENDENT_AMBULATORY_CARE_PROVIDER_SITE_OTHER): Payer: Medicare Other | Admitting: Ophthalmology

## 2019-05-31 DIAGNOSIS — I10 Essential (primary) hypertension: Secondary | ICD-10-CM | POA: Diagnosis not present

## 2019-05-31 DIAGNOSIS — H338 Other retinal detachments: Secondary | ICD-10-CM

## 2019-05-31 DIAGNOSIS — H35033 Hypertensive retinopathy, bilateral: Secondary | ICD-10-CM

## 2019-05-31 DIAGNOSIS — H353211 Exudative age-related macular degeneration, right eye, with active choroidal neovascularization: Secondary | ICD-10-CM | POA: Diagnosis not present

## 2019-05-31 DIAGNOSIS — H4423 Degenerative myopia, bilateral: Secondary | ICD-10-CM

## 2019-05-31 DIAGNOSIS — H353122 Nonexudative age-related macular degeneration, left eye, intermediate dry stage: Secondary | ICD-10-CM

## 2019-05-31 DIAGNOSIS — H43813 Vitreous degeneration, bilateral: Secondary | ICD-10-CM | POA: Diagnosis not present

## 2019-06-22 ENCOUNTER — Other Ambulatory Visit: Payer: Self-pay | Admitting: Cardiovascular Disease

## 2019-06-23 ENCOUNTER — Other Ambulatory Visit: Payer: Self-pay | Admitting: Cardiovascular Disease

## 2019-06-23 NOTE — Telephone Encounter (Signed)
Eliquis 5mg  refill request received, pt is 78yrs old, weight-84.4kg, Crea-0.80 on 11/23/2018 via scanned labs from [facility], [place], and last seen by Dr. Acie Fredrickson on 11/25/2018. Dose is appropriate based on dosing criteria. Will send in refill to requested pharmacy.

## 2019-06-28 ENCOUNTER — Encounter (INDEPENDENT_AMBULATORY_CARE_PROVIDER_SITE_OTHER): Payer: Medicare Other | Admitting: Ophthalmology

## 2019-06-28 DIAGNOSIS — H4423 Degenerative myopia, bilateral: Secondary | ICD-10-CM | POA: Diagnosis not present

## 2019-06-28 DIAGNOSIS — H353122 Nonexudative age-related macular degeneration, left eye, intermediate dry stage: Secondary | ICD-10-CM

## 2019-06-28 DIAGNOSIS — I10 Essential (primary) hypertension: Secondary | ICD-10-CM

## 2019-06-28 DIAGNOSIS — H35033 Hypertensive retinopathy, bilateral: Secondary | ICD-10-CM

## 2019-06-28 DIAGNOSIS — H43813 Vitreous degeneration, bilateral: Secondary | ICD-10-CM

## 2019-06-28 DIAGNOSIS — H338 Other retinal detachments: Secondary | ICD-10-CM

## 2019-06-28 DIAGNOSIS — H353211 Exudative age-related macular degeneration, right eye, with active choroidal neovascularization: Secondary | ICD-10-CM

## 2019-08-02 ENCOUNTER — Encounter (INDEPENDENT_AMBULATORY_CARE_PROVIDER_SITE_OTHER): Payer: Medicare Other | Admitting: Ophthalmology

## 2019-08-02 DIAGNOSIS — H338 Other retinal detachments: Secondary | ICD-10-CM

## 2019-08-02 DIAGNOSIS — H43813 Vitreous degeneration, bilateral: Secondary | ICD-10-CM

## 2019-08-02 DIAGNOSIS — I10 Essential (primary) hypertension: Secondary | ICD-10-CM

## 2019-08-02 DIAGNOSIS — H353211 Exudative age-related macular degeneration, right eye, with active choroidal neovascularization: Secondary | ICD-10-CM

## 2019-08-02 DIAGNOSIS — H35033 Hypertensive retinopathy, bilateral: Secondary | ICD-10-CM

## 2019-08-02 DIAGNOSIS — H4423 Degenerative myopia, bilateral: Secondary | ICD-10-CM

## 2019-08-02 DIAGNOSIS — H353122 Nonexudative age-related macular degeneration, left eye, intermediate dry stage: Secondary | ICD-10-CM

## 2019-08-31 ENCOUNTER — Other Ambulatory Visit: Payer: Self-pay | Admitting: Cardiovascular Disease

## 2019-09-06 ENCOUNTER — Encounter (INDEPENDENT_AMBULATORY_CARE_PROVIDER_SITE_OTHER): Payer: Medicare Other | Admitting: Ophthalmology

## 2019-09-06 DIAGNOSIS — H35033 Hypertensive retinopathy, bilateral: Secondary | ICD-10-CM

## 2019-09-06 DIAGNOSIS — H353211 Exudative age-related macular degeneration, right eye, with active choroidal neovascularization: Secondary | ICD-10-CM | POA: Diagnosis not present

## 2019-09-06 DIAGNOSIS — I10 Essential (primary) hypertension: Secondary | ICD-10-CM | POA: Diagnosis not present

## 2019-09-06 DIAGNOSIS — H353122 Nonexudative age-related macular degeneration, left eye, intermediate dry stage: Secondary | ICD-10-CM

## 2019-09-06 DIAGNOSIS — H43813 Vitreous degeneration, bilateral: Secondary | ICD-10-CM | POA: Diagnosis not present

## 2019-09-06 DIAGNOSIS — H4423 Degenerative myopia, bilateral: Secondary | ICD-10-CM | POA: Diagnosis not present

## 2019-10-11 ENCOUNTER — Encounter (INDEPENDENT_AMBULATORY_CARE_PROVIDER_SITE_OTHER): Payer: Medicare Other | Admitting: Ophthalmology

## 2019-10-11 DIAGNOSIS — H35371 Puckering of macula, right eye: Secondary | ICD-10-CM

## 2019-10-11 DIAGNOSIS — I10 Essential (primary) hypertension: Secondary | ICD-10-CM

## 2019-10-11 DIAGNOSIS — H353122 Nonexudative age-related macular degeneration, left eye, intermediate dry stage: Secondary | ICD-10-CM

## 2019-10-11 DIAGNOSIS — H4423 Degenerative myopia, bilateral: Secondary | ICD-10-CM | POA: Diagnosis not present

## 2019-10-11 DIAGNOSIS — H353211 Exudative age-related macular degeneration, right eye, with active choroidal neovascularization: Secondary | ICD-10-CM | POA: Diagnosis not present

## 2019-10-11 DIAGNOSIS — H2512 Age-related nuclear cataract, left eye: Secondary | ICD-10-CM

## 2019-10-11 DIAGNOSIS — H35033 Hypertensive retinopathy, bilateral: Secondary | ICD-10-CM | POA: Diagnosis not present

## 2019-10-11 DIAGNOSIS — H43813 Vitreous degeneration, bilateral: Secondary | ICD-10-CM | POA: Diagnosis not present

## 2019-10-11 DIAGNOSIS — H338 Other retinal detachments: Secondary | ICD-10-CM

## 2019-11-01 ENCOUNTER — Encounter: Payer: Self-pay | Admitting: Gastroenterology

## 2019-11-15 ENCOUNTER — Encounter (INDEPENDENT_AMBULATORY_CARE_PROVIDER_SITE_OTHER): Payer: Medicare Other | Admitting: Ophthalmology

## 2019-11-15 ENCOUNTER — Other Ambulatory Visit: Payer: Self-pay

## 2019-11-15 DIAGNOSIS — H353122 Nonexudative age-related macular degeneration, left eye, intermediate dry stage: Secondary | ICD-10-CM

## 2019-11-15 DIAGNOSIS — I10 Essential (primary) hypertension: Secondary | ICD-10-CM | POA: Diagnosis not present

## 2019-11-15 DIAGNOSIS — H338 Other retinal detachments: Secondary | ICD-10-CM | POA: Diagnosis not present

## 2019-11-15 DIAGNOSIS — H35033 Hypertensive retinopathy, bilateral: Secondary | ICD-10-CM

## 2019-11-15 DIAGNOSIS — H43813 Vitreous degeneration, bilateral: Secondary | ICD-10-CM | POA: Diagnosis not present

## 2019-11-15 DIAGNOSIS — H4423 Degenerative myopia, bilateral: Secondary | ICD-10-CM | POA: Diagnosis not present

## 2019-11-15 DIAGNOSIS — H353211 Exudative age-related macular degeneration, right eye, with active choroidal neovascularization: Secondary | ICD-10-CM

## 2019-11-22 DIAGNOSIS — E1151 Type 2 diabetes mellitus with diabetic peripheral angiopathy without gangrene: Secondary | ICD-10-CM | POA: Diagnosis not present

## 2019-11-22 DIAGNOSIS — Z125 Encounter for screening for malignant neoplasm of prostate: Secondary | ICD-10-CM | POA: Diagnosis not present

## 2019-11-22 DIAGNOSIS — E7849 Other hyperlipidemia: Secondary | ICD-10-CM | POA: Diagnosis not present

## 2019-11-22 DIAGNOSIS — E038 Other specified hypothyroidism: Secondary | ICD-10-CM | POA: Diagnosis not present

## 2019-11-29 ENCOUNTER — Other Ambulatory Visit: Payer: Self-pay | Admitting: Cardiovascular Disease

## 2019-11-29 DIAGNOSIS — E1151 Type 2 diabetes mellitus with diabetic peripheral angiopathy without gangrene: Secondary | ICD-10-CM | POA: Diagnosis not present

## 2019-11-29 DIAGNOSIS — I739 Peripheral vascular disease, unspecified: Secondary | ICD-10-CM | POA: Diagnosis not present

## 2019-11-29 DIAGNOSIS — Z1331 Encounter for screening for depression: Secondary | ICD-10-CM | POA: Diagnosis not present

## 2019-11-29 DIAGNOSIS — Z1339 Encounter for screening examination for other mental health and behavioral disorders: Secondary | ICD-10-CM | POA: Diagnosis not present

## 2019-11-29 DIAGNOSIS — E039 Hypothyroidism, unspecified: Secondary | ICD-10-CM | POA: Diagnosis not present

## 2019-11-29 DIAGNOSIS — H3530 Unspecified macular degeneration: Secondary | ICD-10-CM | POA: Diagnosis not present

## 2019-11-29 DIAGNOSIS — R82998 Other abnormal findings in urine: Secondary | ICD-10-CM | POA: Diagnosis not present

## 2019-11-29 DIAGNOSIS — E785 Hyperlipidemia, unspecified: Secondary | ICD-10-CM | POA: Diagnosis not present

## 2019-11-29 DIAGNOSIS — I4821 Permanent atrial fibrillation: Secondary | ICD-10-CM | POA: Diagnosis not present

## 2019-11-29 DIAGNOSIS — Z7901 Long term (current) use of anticoagulants: Secondary | ICD-10-CM | POA: Diagnosis not present

## 2019-11-29 DIAGNOSIS — Z0000 Encounter for general adult medical examination without abnormal findings: Secondary | ICD-10-CM | POA: Diagnosis not present

## 2019-11-30 DIAGNOSIS — Z1212 Encounter for screening for malignant neoplasm of rectum: Secondary | ICD-10-CM | POA: Diagnosis not present

## 2019-12-07 ENCOUNTER — Other Ambulatory Visit: Payer: Self-pay

## 2019-12-07 ENCOUNTER — Encounter: Payer: Self-pay | Admitting: Cardiovascular Disease

## 2019-12-07 ENCOUNTER — Ambulatory Visit (INDEPENDENT_AMBULATORY_CARE_PROVIDER_SITE_OTHER): Payer: Medicare Other | Admitting: Cardiovascular Disease

## 2019-12-07 VITALS — BP 142/54 | HR 60 | Ht 74.0 in | Wt 188.5 lb

## 2019-12-07 DIAGNOSIS — I482 Chronic atrial fibrillation, unspecified: Secondary | ICD-10-CM | POA: Diagnosis not present

## 2019-12-07 DIAGNOSIS — I4891 Unspecified atrial fibrillation: Secondary | ICD-10-CM

## 2019-12-07 MED ORDER — RIVAROXABAN 20 MG PO TABS: 20.0000 mg | ORAL_TABLET | Freq: Every day | ORAL | 3 refills | Status: DC

## 2019-12-07 NOTE — Progress Notes (Signed)
Cardiology Office Note   Date:  12/07/2019   ID:  Craig Simon, DOB 1941/07/03, MRN [id number]  PCP:  Leanna Battles, MD  Cardiologist:   Mertie Moores, MD   Chief Complaint  Patient presents with  . Atrial Fibrillation   1. Atrial Fibrillation 2. Hypothyroidism 3. Diabetes mellitus 4. Hyperlipidemia 5. Obstructive sleep apnea- wears CPAP at night   Craig Simon is a 79 year old gentleman with a history of atrial fibrillation. He's done quite well from a cardiac standpoint. He's not had any episodes of chest pain or shortness of breath. He's been able to do all of his normal activities without any problems.  He has noticed some leg edema. He has not been eating much salt.  October 07, 2013:  Craig Simon is doing well. He is able to do all of his normal activities without any significant problems. He denies any chest pain or shortness breath.   October 16, 2014:  Craig Simon is a 79 y.o. male who presents for f/u of his atrial fib   No CP , no dyspnea. Still very active.[name]   October 18, 2015:  Staying very active.    BP and HR are great.   No CP or dyspena Going canoeing this Saturday.   October 22, 2016:    Craig Simon  seen back today for further evaluation of his chronic atrial fibrillation   Under lots of stress .   Nov 20, 2017:  Craig Simon is seen back today for follow up office visit  For his atrial fib  No CP or dyspnea.     December 07, 2019: Craig Simon is seen back today for follow-up of his atrial fibrillation.  His last echocardiogram was in 2016 which reveals normal left ventricular systolic function.  He has severe dilatation of the both atrium.  He has moderate tricuspid regurgitation.  PA pressure estimated at 37 mmHg.  His insurance company wants him to switch from Eliquis to [place].  Apparently it it is the formulary medication for his insurance. No hematuria , no hx of GI bleeding   No cp or dyspnea.   Past Medical History:  Diagnosis Date  . Chest pain   . CHF (congestive heart  failure) ([place])   . Chronic anticoagulation   . Chronic atrial fibrillation ([place])   . Diabetes mellitus   . Hyperlipidemia   . Hypothyroidism   . Internal hemorrhoids without mention of complication 99991111  . Leg edema   . Macular degeneration   . Mitral regurgitation   . Sleep apnea    has cpap but cannot tolerate    Past Surgical History:  Procedure Laterality Date  . ADENOIDECTOMY  1949  . CARDIAC CATHETERIZATION  11/03/2000   EF 40%  . CARDIOVASCULAR STRESS TEST  09/20/2004   EF 56%. NO EVIDENCE OF ISCHEMIA  . cornea surgery  2009   right eye  . DOPPLER ECHOCARDIOGRAPHY  03/09/2001   EF 50%. MODERATE LVH  . EYE SURGERY  2007   right eye; detached retina  . HAMMER TOE SURGERY  07/20/12   right  . [place]  . US ECHOCARDIOGRAPHY  09/10/2004   EF 55-60%     Current Outpatient Medications  Medication Sig Dispense Refill  . Bevacizumab (AVASTIN) 100 MG/4ML SOLN Inject into the vein as directed.     [name] ELIQUIS 5 MG TABS tablet TAKE 1 TABLET BY MOUTH TWICE A DAY 60 tablet 5  . ezetimibe (ZETIA) 10 MG tablet Take 10 mg by mouth daily.      [name]  fish oil-omega-3 fatty acids 1000 MG capsule Take 2 g by mouth 2 (two) times daily.      . fluticasone (FLONASE) 50 MCG/ACT nasal spray Place 2 sprays into the nose as needed for rhinitis. 1 g 12  . furosemide (LASIX) 40 MG tablet Take 1 tablet (40 mg total) by mouth 3 (three) times a week. Please keep upcoming appt in June with Dr. Acie Fredrickson before anymore refills. Thank you 36 tablet 0  . levothyroxine (SYNTHROID, LEVOTHROID) 150 MCG tablet Take 150 mcg by mouth daily.      [name] lisinopril (ZESTRIL) 5 MG tablet TAKE 1 TABLET BY MOUTH EVERY DAY 90 tablet 3  . metFORMIN (GLUCOPHAGE) 500 MG tablet Take 500 mg by mouth at bedtime.      . metoprolol tartrate (LOPRESSOR) 25 MG tablet TAKE 1/2 TABLET BY MOUTH TWICE A DAY 90 tablet 3  . Multiple Vitamin (MULTIVITAMIN) tablet Take 1 tablet by mouth daily.      . multivitamin-lutein  (OCUVITE-LUTEIN) CAPS Take 1 capsule by mouth daily.      . pioglitazone (ACTOS) 15 MG tablet Take 15 mg by mouth daily.      . potassium chloride SA (KLOR-CON M20) 20 MEQ tablet Take 1 pill twice daily on Mon, Wed, Fri 80 tablet 3  . rosuvastatin (CRESTOR) 20 MG tablet Take 20 mg by mouth daily.      . SF 1.1 % GEL dental gel Place 1 application onto teeth at bedtime.      No current facility-administered medications for this visit.    Allergies:   Patient has no known allergies.    Social History:  The patient  reports that he has never smoked. He has never used smokeless tobacco. He reports current alcohol use. He reports that he does not use drugs.   Family History:  The patient's family history includes Heart disease in his brother, father, and mother.    ROS:   Noted in current history, otherwise review of systems is negative.  Physical Exam: Blood pressure (!) 142/54, pulse 60, height 6\' 2"  (1.88 m), weight 188 lb 8 oz (85.5 kg), SpO2 96 %.  GEN:  Well nourished, well developed in no acute distress HEENT: Normal NECK: No JVD; No carotid bruits LYMPHATICS: No lymphadenopathy CARDIAC: Irreg. Irreg.  RESPIRATORY:  Clear to auscultation without rales, wheezing or rhonchi  ABDOMEN: Soft, non-tender, non-distended MUSCULOSKELETAL:  No edema; No deformity  SKIN: Warm and dry NEUROLOGIC:  Alert and oriented x 3    EKG:    -December 07, 2019: Atrial fibrillation with a heart rate of 61.  No ST or T wave changes.  Recent Labs: No results found for requested labs within last 8760 hours.    Lipid Panel    Component Value Date/Time   CHOL 101 05/07/2011 1117   TRIG 119.0 05/07/2011 1117   HDL 48.10 05/07/2011 1117   CHOLHDL 2 05/07/2011 1117   VLDL 23.8 05/07/2011 1117   LDLCALC 29 05/07/2011 1117      Wt Readings from Last 3 Encounters:  12/07/19 188 lb 8 oz (85.5 kg)  11/25/18 186 lb (84.4 kg)  11/20/17 192 lb 12.8 oz (87.5 kg)      Other studies  Reviewed: Additional studies/ records that were reviewed today include: INR levesl . Review of the above records demonstrates: he has been theraputic  For his INR    ASSESSMENT AND PLAN:  1.  Atrial fibrillation: He has chronic atrial fibrillation.  He has been on  Eliquis 5 mg twice a day.  His insurance company would like for him to switch to Xarelto as it is on their formulary.  We will switch him to 20 mg a day.  He has been instructed to take his evening dose of Eliquis and then skip the morning dose.  He will then take his first dose of Xarelto midday which is just typically his largest meal of the day.  We will continue 20 mg daily.  I will see him in 1 year.   Current medicines are reviewed at length with the patient today.  The patient does not have concerns regarding medicines.  The following changes have been made:  See above   Labs/ tests ordered today include:  No orders of the defined types were placed in this encounter.    Disposition:   FU with me in 1 year    Signed, Mertie Moores, MD  12/07/2019 2:09 PM    [facility] [place], [place], [place]  [address] Phone: [phone]; Fax: [phone]

## 2019-12-07 NOTE — Patient Instructions (Addendum)
Your physician has recommended you make the following change in your medication:  STOP ELIQUIS  START XARELTO 20 MG EVERY DAY ONCE STARTED TAKE WITH LARGEST MEAL OF THE DAY   Your physician wants you to follow-up in: [place] will receive a reminder letter in the mail two months in advance. If you don't receive a letter, please call our office to schedule the follow-up appointment.

## 2019-12-11 ENCOUNTER — Other Ambulatory Visit: Payer: Self-pay | Admitting: Cardiovascular Disease

## 2019-12-12 ENCOUNTER — Encounter: Payer: Self-pay | Admitting: Gastroenterology

## 2019-12-12 NOTE — Telephone Encounter (Signed)
Pt last saw Dr Acie Fredrickson 12/07/19, last labs 11/22/19 Creat 0.9 at [facility] per [place], age 79, weight 85.5kg.  Per OV note on 12/07/19 with Dr Acie Fredrickson pts insurance company wants him to switch from Eliquis to [place].  Eliquis was d/c at [facility] and a new rx for Xarelto 20mg  QD was sent in for pt.

## 2019-12-16 ENCOUNTER — Other Ambulatory Visit: Payer: Self-pay | Admitting: Cardiovascular Disease

## 2019-12-22 ENCOUNTER — Other Ambulatory Visit: Payer: Self-pay

## 2019-12-22 ENCOUNTER — Encounter (INDEPENDENT_AMBULATORY_CARE_PROVIDER_SITE_OTHER): Payer: Medicare Other | Admitting: Ophthalmology

## 2019-12-22 DIAGNOSIS — I10 Essential (primary) hypertension: Secondary | ICD-10-CM

## 2019-12-22 DIAGNOSIS — H4423 Degenerative myopia, bilateral: Secondary | ICD-10-CM | POA: Diagnosis not present

## 2019-12-22 DIAGNOSIS — H35033 Hypertensive retinopathy, bilateral: Secondary | ICD-10-CM | POA: Diagnosis not present

## 2019-12-22 DIAGNOSIS — H353221 Exudative age-related macular degeneration, left eye, with active choroidal neovascularization: Secondary | ICD-10-CM

## 2019-12-22 DIAGNOSIS — H353112 Nonexudative age-related macular degeneration, right eye, intermediate dry stage: Secondary | ICD-10-CM | POA: Diagnosis not present

## 2019-12-22 DIAGNOSIS — H43813 Vitreous degeneration, bilateral: Secondary | ICD-10-CM | POA: Diagnosis not present

## 2020-02-02 ENCOUNTER — Encounter (INDEPENDENT_AMBULATORY_CARE_PROVIDER_SITE_OTHER): Payer: Medicare Other | Admitting: Ophthalmology

## 2020-02-02 ENCOUNTER — Ambulatory Visit (INDEPENDENT_AMBULATORY_CARE_PROVIDER_SITE_OTHER): Payer: Medicare Other | Admitting: Gastroenterology

## 2020-02-02 ENCOUNTER — Encounter: Payer: Self-pay | Admitting: Gastroenterology

## 2020-02-02 ENCOUNTER — Telehealth: Payer: Self-pay

## 2020-02-02 ENCOUNTER — Other Ambulatory Visit: Payer: Self-pay

## 2020-02-02 VITALS — BP 120/64 | HR 64 | Ht 74.0 in | Wt 190.6 lb

## 2020-02-02 DIAGNOSIS — H353122 Nonexudative age-related macular degeneration, left eye, intermediate dry stage: Secondary | ICD-10-CM | POA: Diagnosis not present

## 2020-02-02 DIAGNOSIS — Z86010 Personal history of colonic polyps: Secondary | ICD-10-CM | POA: Diagnosis not present

## 2020-02-02 DIAGNOSIS — H4423 Degenerative myopia, bilateral: Secondary | ICD-10-CM | POA: Diagnosis not present

## 2020-02-02 DIAGNOSIS — H338 Other retinal detachments: Secondary | ICD-10-CM | POA: Diagnosis not present

## 2020-02-02 DIAGNOSIS — H43813 Vitreous degeneration, bilateral: Secondary | ICD-10-CM | POA: Diagnosis not present

## 2020-02-02 DIAGNOSIS — H35033 Hypertensive retinopathy, bilateral: Secondary | ICD-10-CM

## 2020-02-02 DIAGNOSIS — H353211 Exudative age-related macular degeneration, right eye, with active choroidal neovascularization: Secondary | ICD-10-CM

## 2020-02-02 DIAGNOSIS — I10 Essential (primary) hypertension: Secondary | ICD-10-CM | POA: Diagnosis not present

## 2020-02-02 DIAGNOSIS — Z7901 Long term (current) use of anticoagulants: Secondary | ICD-10-CM | POA: Diagnosis not present

## 2020-02-02 MED ORDER — SUTAB 1479-225-188 MG PO TABS
1.0000 | ORAL_TABLET | ORAL | 0 refills | Status: DC
Start: 2020-02-02 — End: 2020-03-28

## 2020-02-02 NOTE — Patient Instructions (Signed)
You have been scheduled for a colonoscopy. Please follow written instructions given to you at your visit today.  Please pick up your prep supplies at the pharmacy within the next 1-3 days. If you use inhalers (even only as needed), please bring them with you on the day of your procedure.  Thank you for choosing me and [facility].  Malcolm T. Stark, Jr., MD., FACG  

## 2020-02-02 NOTE — Telephone Encounter (Signed)
[facility] Pre-operative Risk Assessment     Request for surgical clearance:     Endoscopy Procedure  What type of surgery is being performed?     Colonoscopy  When is this surgery scheduled?     03/28/20  What type of clearance is required ?   Pharmacy  Are there any medications that need to be held prior to surgery and how long? Xarelto x 2 days  Practice name and name of physician performing surgery?      [facility]  What is your office phone and fax number?      Phone- [phone]  [phone]  Anesthesia type (None, local, MAC, general) ?       MAC

## 2020-02-02 NOTE — Progress Notes (Signed)
History of Present Illness: This is a 79 year old male referred by Leanna Battles, MD for the evaluation of a personal history of adenomatous colon polyps. Last colonoscopy in 2016 with 1 adenomatous polyp and internal hemorrhoids. He is maintained on Xarelto. No GI complaints. Denies weight loss, abdominal pain, constipation, diarrhea, change in stool caliber, melena, hematochezia, nausea, vomiting, dysphagia, reflux symptoms, chest pain.   No Known Allergies Outpatient Medications Prior to Visit  Medication Sig Dispense Refill  . Bevacizumab (AVASTIN) 100 MG/4ML SOLN Inject into the vein as directed.     . ezetimibe (ZETIA) 10 MG tablet Take 10 mg by mouth daily.      . fish oil-omega-3 fatty acids 1000 MG capsule Take 2 g by mouth 2 (two) times daily.      . fluticasone (FLONASE) 50 MCG/ACT nasal spray Place 2 sprays into the nose as needed for rhinitis. 1 g 12  . furosemide (LASIX) 40 MG tablet Take 1 tablet (40 mg total) by mouth 3 (three) times a week. Please keep upcoming appt in June with Dr. Acie Fredrickson before anymore refills. Thank you 36 tablet 0  . levothyroxine (SYNTHROID, LEVOTHROID) 150 MCG tablet Take 150 mcg by mouth daily.      [name] lisinopril (ZESTRIL) 5 MG tablet TAKE 1 TABLET BY MOUTH EVERY DAY 90 tablet 3  . metFORMIN (GLUCOPHAGE) 500 MG tablet Take 500 mg by mouth at bedtime.      . metoprolol tartrate (LOPRESSOR) 25 MG tablet TAKE 1/2 TABLET BY MOUTH TWICE A DAY 90 tablet 3  . Multiple Vitamin (MULTIVITAMIN) tablet Take 1 tablet by mouth daily.      . multivitamin-lutein (OCUVITE-LUTEIN) CAPS Take 1 capsule by mouth daily.      . pioglitazone (ACTOS) 15 MG tablet Take 15 mg by mouth daily.      . potassium chloride SA (KLOR-CON M20) 20 MEQ tablet Take 1 pill twice daily on Mon, Wed, Fri 80 tablet 3  . rivaroxaban (XARELTO) 20 MG TABS tablet Take 1 tablet (20 mg total) by mouth daily with supper. 90 tablet 3  . rosuvastatin (CRESTOR) 20 MG tablet Take 20 mg by mouth daily.       . SF 1.1 % GEL dental gel Place 1 application onto teeth at bedtime.      No facility-administered medications prior to visit.   Past Medical History:  Diagnosis Date  . Allergic rhinitis   . Chest pain   . CHF (congestive heart failure) ([place])   . Chronic anticoagulation   . Chronic atrial fibrillation ([place])   . Diabetes mellitus   . Hyperlipidemia   . Hypothyroidism   . Internal hemorrhoids without mention of complication 93/90/3009  . Leg edema   . Macular degeneration   . Mitral regurgitation   . Sleep apnea    has cpap but cannot tolerate   Past Surgical History:  Procedure Laterality Date  . ADENOIDECTOMY  1949  . CARDIAC CATHETERIZATION  11/03/2000   EF 40%  . CARDIOVASCULAR STRESS TEST  09/20/2004   EF 56%. NO EVIDENCE OF ISCHEMIA  . cornea surgery  2009   right eye  . DOPPLER ECHOCARDIOGRAPHY  03/09/2001   EF 50%. MODERATE LVH  . EYE SURGERY  2007   right eye; detached retina  . HAMMER TOE SURGERY  07/20/12   right  . [place]  . US ECHOCARDIOGRAPHY  09/10/2004   EF 55-60%   Social History   Socioeconomic History  .  Marital status: Single    Spouse name: Not on file  . Number of children: 1  . Years of education: Not on file  . Highest education level: Not on file  Occupational History  . Occupation: Retired  Tobacco Use  . Smoking status: Former Smoker    Types: Cigars, Pipe  . Smokeless tobacco: Never Used  Vaping Use  . Vaping Use: Never used  Substance and Sexual Activity  . Alcohol use: Yes    Comment: 1/2 glass a night   . Drug use: No  . Sexual activity: Not on file  Other Topics Concern  . Not on file  Social History Narrative   Daily caffeine    Social Determinants of Health   Financial Resource Strain:   . Difficulty of Paying Living Expenses:   Food Insecurity:   . Worried About Charity fundraiser in the Last Year:   . Arboriculturist in the Last Year:   Transportation Needs:   . Film/video editor  (Medical):   [name] Lack of Transportation (Non-Medical):   Physical Activity:   . Days of Exercise per Week:   . Minutes of Exercise per Session:   Stress:   . Feeling of Stress :   Social Connections:   . Frequency of Communication with Friends and Family:   . Frequency of Social Gatherings with Friends and Family:   . Attends Religious Services:   . Active Member of Clubs or Organizations:   . Attends Archivist Meetings:   [name] Marital Status:    Family History  Problem Relation Age of Onset  . Heart disease Mother   . Heart disease Father   . Heart disease Brother   . Colon cancer Neg Hx   . Stomach cancer Neg Hx   . Rectal cancer Neg Hx   . Esophageal cancer Neg Hx      Review of Systems: Pertinent positive and negative review of systems were noted in the above HPI section. All other review of systems were otherwise negative.   Physical Exam: General: Well developed, well nourished, no acute distress Head: Normocephalic and atraumatic Eyes:  sclerae anicteric, EOMI Ears: Normal auditory acuity Mouth: Not examined, mask on during Covid-19 pandemic Neck: Supple, no masses or thyromegaly Lungs: Clear throughout to auscultation Heart: Regular rate and rhythm; no murmurs, rubs or bruits Abdomen: Soft, non tender and non distended. No masses, hepatosplenomegaly or hernias noted. Normal Bowel sounds Rectal: Deferred to colonoscopy Musculoskeletal: Symmetrical with no gross deformities  Skin: No lesions on visible extremities Pulses:  Normal pulses noted Extremities: No clubbing, cyanosis, edema or deformities noted Neurological: Alert oriented x 4, grossly nonfocal Cervical Nodes:  No significant cervical adenopathy Inguinal Nodes: No significant inguinal adenopathy Psychological:  Alert and cooperative. Normal mood and affect   Assessment and Recommendations:  1. Personal history of adenomatous colon polyps.  Schedule colonoscopy.  The risks (including bleeding,  perforation, infection, missed lesions, medication reactions and possible hospitalization or surgery if complications occur), benefits, and alternatives to colonoscopy with possible biopsy and possible polypectomy were discussed with the patient and they consent to proceed.   2. Afib. Chronic anticoagulation. Hold Xarelto 2 days before procedure - will instruct when and how to resume after procedure. Low but real risk of cardiovascular event such as heart attack, stroke, embolism, thrombosis or ischemia/infarct of other organs off Xarelto explained and need to seek urgent help if this occurs. The patient consents to proceed. Will communicate  by phone or EMR with patient's prescribing provider to confirm that holding Xarelto is reasonable in this case.     cc: Leanna Battles, MD [address] [place],  [place] [address]

## 2020-02-02 NOTE — Telephone Encounter (Signed)
Patient with diagnosis of afib on Xarelto for anticoagulation.    Procedure: colonoscopy Date of procedure: 03/28/20  CHADS2-VASc score of 4 (age x2, CHF, DM)  CrCl 57mL/min Platelet count 121K  Per office protocol, patient can hold Xarelto for 2 days prior to procedure as requested.

## 2020-02-02 NOTE — Telephone Encounter (Signed)
° °  Primary Cardiologist: Mertie Moores, MD  Chart reviewed as part of pre-operative protocol coverage. Given past medical history and time since last visit, based on ACC/AHA guidelines, Craig Simon would be at acceptable risk for the planned procedure without further cardiovascular testing.   Patient with diagnosis of afib on Xarelto for anticoagulation.    Procedure: colonoscopy Date of procedure: 03/28/20  CHADS2-VASc score of 4 (age x2, CHF, DM)  CrCl 39mL/min Platelet count 121K  Per office protocol, patient can hold Xarelto for 2 days prior to procedure as requested.  I will route this recommendation to the requesting party via Epic fax function and remove from pre-op pool.  Please call with questions.  Jossie Ng. Niza Soderholm NP-C    02/02/2020, 10:09 AM [place] [address] Office [phone] Fax [phone]

## 2020-02-03 NOTE — Telephone Encounter (Signed)
Patient is returning your call.  

## 2020-02-03 NOTE — Telephone Encounter (Signed)
Informed patient to hold Xarelto 2 days prior to procedure his procedure. Patient verbalized understanding.

## 2020-02-03 NOTE — Telephone Encounter (Signed)
Left a message for patient to return my call. 

## 2020-02-22 ENCOUNTER — Other Ambulatory Visit: Payer: Self-pay | Admitting: Cardiovascular Disease

## 2020-03-11 ENCOUNTER — Other Ambulatory Visit: Payer: Self-pay | Admitting: Cardiovascular Disease

## 2020-03-15 ENCOUNTER — Encounter (INDEPENDENT_AMBULATORY_CARE_PROVIDER_SITE_OTHER): Payer: Medicare Other | Admitting: Ophthalmology

## 2020-03-15 ENCOUNTER — Other Ambulatory Visit: Payer: Self-pay

## 2020-03-15 DIAGNOSIS — I10 Essential (primary) hypertension: Secondary | ICD-10-CM | POA: Diagnosis not present

## 2020-03-15 DIAGNOSIS — H353122 Nonexudative age-related macular degeneration, left eye, intermediate dry stage: Secondary | ICD-10-CM | POA: Diagnosis not present

## 2020-03-15 DIAGNOSIS — H35033 Hypertensive retinopathy, bilateral: Secondary | ICD-10-CM | POA: Diagnosis not present

## 2020-03-15 DIAGNOSIS — H353211 Exudative age-related macular degeneration, right eye, with active choroidal neovascularization: Secondary | ICD-10-CM | POA: Diagnosis not present

## 2020-03-15 DIAGNOSIS — H43813 Vitreous degeneration, bilateral: Secondary | ICD-10-CM | POA: Diagnosis not present

## 2020-03-15 DIAGNOSIS — H338 Other retinal detachments: Secondary | ICD-10-CM

## 2020-03-28 ENCOUNTER — Ambulatory Visit (AMBULATORY_SURGERY_CENTER): Payer: Medicare Other | Admitting: Gastroenterology

## 2020-03-28 ENCOUNTER — Other Ambulatory Visit: Payer: Self-pay

## 2020-03-28 ENCOUNTER — Encounter: Payer: Self-pay | Admitting: Gastroenterology

## 2020-03-28 VITALS — BP 116/41 | HR 71 | Temp 96.8°F | Resp 15 | Ht 74.0 in | Wt 190.0 lb

## 2020-03-28 DIAGNOSIS — Z86010 Personal history of colonic polyps: Secondary | ICD-10-CM

## 2020-03-28 DIAGNOSIS — D122 Benign neoplasm of ascending colon: Secondary | ICD-10-CM

## 2020-03-28 DIAGNOSIS — Z1211 Encounter for screening for malignant neoplasm of colon: Secondary | ICD-10-CM | POA: Diagnosis not present

## 2020-03-28 DIAGNOSIS — D124 Benign neoplasm of descending colon: Secondary | ICD-10-CM | POA: Diagnosis not present

## 2020-03-28 MED ORDER — SODIUM CHLORIDE 0.9 % IV SOLN: 500.0000 mL | Freq: Once | INTRAVENOUS | Status: DC

## 2020-03-28 NOTE — Progress Notes (Signed)
Called to room to assist during endoscopic procedure.  Patient ID and intended procedure confirmed with present staff. Received instructions for my participation in the procedure from the performing physician.  

## 2020-03-28 NOTE — Progress Notes (Signed)
To PACU, VSS. Report to [email or web address] 

## 2020-03-28 NOTE — Patient Instructions (Signed)
Handout given;  Polyps  Resume previous diet continue present medications Restart Xarelto in 2 days (Saturday) Await pathology results No aspirin , ibuprofen, naproxen, or other NSAIS for 2 days after procedure NO REPEAT COLONOSCOPY DUE TO AGE  YOU HAD AN ENDOSCOPIC PROCEDURE TODAY AT [facility]:   Refer to the procedure report that was given to you for any specific questions about what was found during the examination.  If the procedure report does not answer your questions, please call your gastroenterologist to clarify.  If you requested that your care partner not be given the details of your procedure findings, then the procedure report has been included in a sealed envelope for you to review at your convenience later.  YOU SHOULD EXPECT: Some feelings of bloating in the abdomen. Passage of more gas than usual.  Walking can help get rid of the air that was put into your GI tract during the procedure and reduce the bloating. If you had a lower endoscopy (such as a colonoscopy or flexible sigmoidoscopy) you may notice spotting of blood in your stool or on the toilet paper. If you underwent a bowel prep for your procedure, you may not have a normal bowel movement for a few days.  Please Note:  You might notice some irritation and congestion in your nose or some drainage.  This is from the oxygen used during your procedure.  There is no need for concern and it should clear up in a day or so.  SYMPTOMS TO REPORT IMMEDIATELY:   Following lower endoscopy (colonoscopy or flexible sigmoidoscopy):  Excessive amounts of blood in the stool  Significant tenderness or worsening of abdominal pains  Swelling of the abdomen that is new, acute  Fever of 100F or higher    For urgent or emergent issues, a gastroenterologist can be reached at any hour by calling [phone]. Do not use MyChart messaging for urgent concerns.    DIET:  We do recommend a small meal at first, but then  you may proceed to your regular diet.  Drink plenty of fluids but you should avoid alcoholic beverages for 24 hours.  ACTIVITY:  You should plan to take it easy for the rest of today and you should NOT DRIVE or use heavy machinery until tomorrow (because of the sedation medicines used during the test).    FOLLOW UP: Our staff will call the number listed on your records 48-72 hours following your procedure to check on you and address any questions or concerns that you may have regarding the information given to you following your procedure. If we do not reach you, we will leave a message.  We will attempt to reach you two times.  During this call, we will ask if you have developed any symptoms of COVID 19. If you develop any symptoms (ie: fever, flu-like symptoms, shortness of breath, cough etc.) before then, please call [phone].  If you test positive for Covid 19 in the 2 weeks post procedure, please call and report this information to [place].    If any biopsies were taken you will be contacted by phone or by letter within the next 1-3 weeks.  Please call us at [phone] if you have not heard about the biopsies in 3 weeks.    SIGNATURES/CONFIDENTIALITY: You and/or your care partner have signed paperwork which will be entered into your electronic medical record.  These signatures attest to the fact that that the information above on your After Visit  Summary has been reviewed and is understood.  Full responsibility of the confidentiality of this discharge information lies with you and/or your care-partner. 

## 2020-03-28 NOTE — Op Note (Signed)
[facility] Patient Name: Craig Simon Procedure Date: 03/28/2020 9:45 AM MRN: [id number] Endoscopist: Ladene Artist , MD Age: 79 Referring MD:  Date of Birth: [birth year] Gender: Male Account #: [id number] Procedure:                Colonoscopy Indications:              Surveillance: Personal history of adenomatous                            polyps on last colonoscopy 5 years ago Medicines:                Monitored Anesthesia Care Procedure:                Pre-Anesthesia Assessment:                           - Prior to the procedure, a History and Physical                            was performed, and patient medications and                            allergies were reviewed. The patient's tolerance of                            previous anesthesia was also reviewed. The risks                            and benefits of the procedure and the sedation                            options and risks were discussed with the patient.                            All questions were answered, and informed consent                            was obtained. Prior Anticoagulants: The patient has                            taken Xarelto (rivaroxaban), last dose was 2 days                            prior to procedure. ASA Grade Assessment: III - A                            patient with severe systemic disease. After                            reviewing the risks and benefits, the patient was                            deemed in satisfactory condition to undergo the  procedure.                           After obtaining informed consent, the colonoscope                            was passed under direct vision. Throughout the                            procedure, the patient's blood pressure, pulse, and                            oxygen saturations were monitored continuously. The                            Colonoscope was introduced through the anus and                             advanced to the the cecum, identified by                            appendiceal orifice and ileocecal valve. The                            ileocecal valve, appendiceal orifice, and rectum                            were photographed. The quality of the bowel                            preparation was good. The colonoscopy was performed                            without difficulty. The patient tolerated the                            procedure well. Scope In: 9:57:41 AM Scope Out: 10:15:19 AM Scope Withdrawal Time: 0 hours 10 minutes 22 seconds  Total Procedure Duration: 0 hours 17 minutes 38 seconds  Findings:                 The perianal and digital rectal examinations were                            normal.                           Two sessile polyps were found in the descending                            colon and ascending colon. The polyps were 6 to 7                            mm in size. These polyps were removed with a cold  snare. Resection and retrieval were complete.                           Internal hemorrhoids were found during                            retroflexion. The hemorrhoids were small and Grade                            I (internal hemorrhoids that do not prolapse).                           The exam was otherwise without abnormality on                            direct and retroflexion views. Complications:            No immediate complications. Estimated blood loss:                            None. Estimated Blood Loss:     Estimated blood loss: none. Impression:               - Two 6 to 7 mm polyps in the descending colon and                            in the ascending colon, removed with a cold snare.                            Resected and retrieved.                           - Internal hemorrhoids.                           - The examination was otherwise normal on direct                            and  retroflexion views. Recommendation:           - Resume Xarelto (rivaroxaban) in 2 days at prior                            dose. Refer to managing physician for further                            adjustment of therapy.                           - Patient has a contact number available for                            emergencies. The signs and symptoms of potential                            delayed complications were discussed with the  patient. Return to normal activities tomorrow.                            Written discharge instructions were provided to the                            patient.                           - Resume previous diet.                           - Continue present medications.                           - Await pathology results.                           - No aspirin, ibuprofen, naproxen, or other                            non-steroidal anti-inflammatory drugs for 2 weeks                            after polyp removal.                           - No repeat colonoscopy due to age. Ladene Artist, MD 03/28/2020 10:19:04 AM This report has been signed electronically.

## 2020-03-28 NOTE — Progress Notes (Signed)
Pt's states no medical or surgical changes since previsit or office visit. 

## 2020-03-30 ENCOUNTER — Telehealth: Payer: Self-pay

## 2020-03-30 NOTE — Telephone Encounter (Signed)
  Follow up Call-  Call back number 03/28/2020  Post procedure Call Back phone  # [phone]  Permission to leave phone message Yes  Some recent data might be hidden     Patient questions:  Do you have a fever, pain , or abdominal swelling? No. Pain Score  0 *  Have you tolerated food without any problems? Yes.    Have you been able to return to your normal activities? Yes.    Do you have any questions about your discharge instructions: Diet   No. Medications  No. Follow up visit  No.  Do you have questions or concerns about your Care? No.  Actions: * If pain score is 4 or above: No action needed, pain <4.   1. Have you developed a fever since your procedure? no  2.   Have you had an respiratory symptoms (SOB or cough) since your procedure? no  3.   Have you tested positive for COVID 19 since your procedure no  4.   Have you had any family members/close contacts diagnosed with the COVID 19 since your procedure?  no   If yes to any of these questions please route to Joylene John, RN and Joella Prince, RN

## 2020-04-06 ENCOUNTER — Encounter: Payer: Self-pay | Admitting: Gastroenterology

## 2020-04-19 ENCOUNTER — Other Ambulatory Visit: Payer: Self-pay

## 2020-04-19 ENCOUNTER — Encounter (INDEPENDENT_AMBULATORY_CARE_PROVIDER_SITE_OTHER): Payer: Medicare Other | Admitting: Ophthalmology

## 2020-04-19 DIAGNOSIS — H35033 Hypertensive retinopathy, bilateral: Secondary | ICD-10-CM | POA: Diagnosis not present

## 2020-04-19 DIAGNOSIS — H353122 Nonexudative age-related macular degeneration, left eye, intermediate dry stage: Secondary | ICD-10-CM | POA: Diagnosis not present

## 2020-04-19 DIAGNOSIS — H4423 Degenerative myopia, bilateral: Secondary | ICD-10-CM

## 2020-04-19 DIAGNOSIS — H338 Other retinal detachments: Secondary | ICD-10-CM | POA: Diagnosis not present

## 2020-04-19 DIAGNOSIS — H353211 Exudative age-related macular degeneration, right eye, with active choroidal neovascularization: Secondary | ICD-10-CM

## 2020-04-19 DIAGNOSIS — H43813 Vitreous degeneration, bilateral: Secondary | ICD-10-CM | POA: Diagnosis not present

## 2020-04-21 DIAGNOSIS — Z23 Encounter for immunization: Secondary | ICD-10-CM | POA: Diagnosis not present

## 2020-05-21 DIAGNOSIS — Z23 Encounter for immunization: Secondary | ICD-10-CM | POA: Diagnosis not present

## 2020-05-24 ENCOUNTER — Other Ambulatory Visit: Payer: Self-pay

## 2020-05-24 ENCOUNTER — Encounter (INDEPENDENT_AMBULATORY_CARE_PROVIDER_SITE_OTHER): Payer: Medicare Other | Admitting: Ophthalmology

## 2020-05-24 DIAGNOSIS — H4423 Degenerative myopia, bilateral: Secondary | ICD-10-CM

## 2020-05-24 DIAGNOSIS — I10 Essential (primary) hypertension: Secondary | ICD-10-CM | POA: Diagnosis not present

## 2020-05-24 DIAGNOSIS — H353211 Exudative age-related macular degeneration, right eye, with active choroidal neovascularization: Secondary | ICD-10-CM | POA: Diagnosis not present

## 2020-05-24 DIAGNOSIS — H35033 Hypertensive retinopathy, bilateral: Secondary | ICD-10-CM

## 2020-05-24 DIAGNOSIS — H353122 Nonexudative age-related macular degeneration, left eye, intermediate dry stage: Secondary | ICD-10-CM | POA: Diagnosis not present

## 2020-05-25 ENCOUNTER — Other Ambulatory Visit: Payer: Self-pay | Admitting: Cardiovascular Disease

## 2020-06-24 ENCOUNTER — Other Ambulatory Visit: Payer: Self-pay | Admitting: Cardiovascular Disease

## 2020-06-28 ENCOUNTER — Other Ambulatory Visit: Payer: Self-pay

## 2020-06-28 ENCOUNTER — Encounter (INDEPENDENT_AMBULATORY_CARE_PROVIDER_SITE_OTHER): Payer: Medicare Other | Admitting: Ophthalmology

## 2020-06-28 DIAGNOSIS — H338 Other retinal detachments: Secondary | ICD-10-CM | POA: Diagnosis not present

## 2020-06-28 DIAGNOSIS — H2512 Age-related nuclear cataract, left eye: Secondary | ICD-10-CM | POA: Diagnosis not present

## 2020-06-28 DIAGNOSIS — H43813 Vitreous degeneration, bilateral: Secondary | ICD-10-CM | POA: Diagnosis not present

## 2020-06-28 DIAGNOSIS — H4423 Degenerative myopia, bilateral: Secondary | ICD-10-CM | POA: Diagnosis not present

## 2020-06-28 DIAGNOSIS — H353112 Nonexudative age-related macular degeneration, right eye, intermediate dry stage: Secondary | ICD-10-CM | POA: Diagnosis not present

## 2020-06-28 DIAGNOSIS — I10 Essential (primary) hypertension: Secondary | ICD-10-CM

## 2020-06-28 DIAGNOSIS — H35033 Hypertensive retinopathy, bilateral: Secondary | ICD-10-CM | POA: Diagnosis not present

## 2020-06-28 DIAGNOSIS — H353221 Exudative age-related macular degeneration, left eye, with active choroidal neovascularization: Secondary | ICD-10-CM | POA: Diagnosis not present

## 2020-07-25 DIAGNOSIS — H25812 Combined forms of age-related cataract, left eye: Secondary | ICD-10-CM | POA: Diagnosis not present

## 2020-07-25 DIAGNOSIS — H353124 Nonexudative age-related macular degeneration, left eye, advanced atrophic with subfoveal involvement: Secondary | ICD-10-CM | POA: Diagnosis not present

## 2020-07-25 DIAGNOSIS — E119 Type 2 diabetes mellitus without complications: Secondary | ICD-10-CM | POA: Diagnosis not present

## 2020-07-25 DIAGNOSIS — H353211 Exudative age-related macular degeneration, right eye, with active choroidal neovascularization: Secondary | ICD-10-CM | POA: Diagnosis not present

## 2020-07-31 ENCOUNTER — Other Ambulatory Visit: Payer: Self-pay

## 2020-07-31 ENCOUNTER — Encounter (INDEPENDENT_AMBULATORY_CARE_PROVIDER_SITE_OTHER): Payer: Medicare Other | Admitting: Ophthalmology

## 2020-07-31 DIAGNOSIS — H4423 Degenerative myopia, bilateral: Secondary | ICD-10-CM | POA: Diagnosis not present

## 2020-07-31 DIAGNOSIS — H338 Other retinal detachments: Secondary | ICD-10-CM | POA: Diagnosis not present

## 2020-07-31 DIAGNOSIS — H43813 Vitreous degeneration, bilateral: Secondary | ICD-10-CM | POA: Diagnosis not present

## 2020-07-31 DIAGNOSIS — H35033 Hypertensive retinopathy, bilateral: Secondary | ICD-10-CM

## 2020-07-31 DIAGNOSIS — H353122 Nonexudative age-related macular degeneration, left eye, intermediate dry stage: Secondary | ICD-10-CM

## 2020-07-31 DIAGNOSIS — H353211 Exudative age-related macular degeneration, right eye, with active choroidal neovascularization: Secondary | ICD-10-CM | POA: Diagnosis not present

## 2020-07-31 DIAGNOSIS — I10 Essential (primary) hypertension: Secondary | ICD-10-CM

## 2020-09-04 ENCOUNTER — Other Ambulatory Visit: Payer: Self-pay

## 2020-09-04 ENCOUNTER — Encounter (INDEPENDENT_AMBULATORY_CARE_PROVIDER_SITE_OTHER): Payer: Medicare Other | Admitting: Ophthalmology

## 2020-09-04 DIAGNOSIS — H353122 Nonexudative age-related macular degeneration, left eye, intermediate dry stage: Secondary | ICD-10-CM | POA: Diagnosis not present

## 2020-09-04 DIAGNOSIS — H338 Other retinal detachments: Secondary | ICD-10-CM | POA: Diagnosis not present

## 2020-09-04 DIAGNOSIS — H35033 Hypertensive retinopathy, bilateral: Secondary | ICD-10-CM | POA: Diagnosis not present

## 2020-09-04 DIAGNOSIS — I10 Essential (primary) hypertension: Secondary | ICD-10-CM

## 2020-09-04 DIAGNOSIS — H353211 Exudative age-related macular degeneration, right eye, with active choroidal neovascularization: Secondary | ICD-10-CM | POA: Diagnosis not present

## 2020-09-04 DIAGNOSIS — H4423 Degenerative myopia, bilateral: Secondary | ICD-10-CM

## 2020-09-04 DIAGNOSIS — H43813 Vitreous degeneration, bilateral: Secondary | ICD-10-CM | POA: Diagnosis not present

## 2020-10-09 ENCOUNTER — Encounter (INDEPENDENT_AMBULATORY_CARE_PROVIDER_SITE_OTHER): Payer: Medicare Other | Admitting: Ophthalmology

## 2020-10-09 ENCOUNTER — Other Ambulatory Visit: Payer: Self-pay

## 2020-10-09 DIAGNOSIS — H2512 Age-related nuclear cataract, left eye: Secondary | ICD-10-CM

## 2020-10-09 DIAGNOSIS — H353211 Exudative age-related macular degeneration, right eye, with active choroidal neovascularization: Secondary | ICD-10-CM

## 2020-10-09 DIAGNOSIS — H338 Other retinal detachments: Secondary | ICD-10-CM

## 2020-10-09 DIAGNOSIS — H35033 Hypertensive retinopathy, bilateral: Secondary | ICD-10-CM | POA: Diagnosis not present

## 2020-10-09 DIAGNOSIS — H353122 Nonexudative age-related macular degeneration, left eye, intermediate dry stage: Secondary | ICD-10-CM

## 2020-10-09 DIAGNOSIS — H43813 Vitreous degeneration, bilateral: Secondary | ICD-10-CM

## 2020-10-09 DIAGNOSIS — H4423 Degenerative myopia, bilateral: Secondary | ICD-10-CM | POA: Diagnosis not present

## 2020-10-09 DIAGNOSIS — I10 Essential (primary) hypertension: Secondary | ICD-10-CM | POA: Diagnosis not present

## 2020-10-26 ENCOUNTER — Other Ambulatory Visit: Payer: Self-pay | Admitting: Cardiovascular Disease

## 2020-11-13 ENCOUNTER — Other Ambulatory Visit: Payer: Self-pay

## 2020-11-13 ENCOUNTER — Encounter (INDEPENDENT_AMBULATORY_CARE_PROVIDER_SITE_OTHER): Payer: Medicare Other | Admitting: Ophthalmology

## 2020-11-13 DIAGNOSIS — H353211 Exudative age-related macular degeneration, right eye, with active choroidal neovascularization: Secondary | ICD-10-CM

## 2020-11-13 DIAGNOSIS — H338 Other retinal detachments: Secondary | ICD-10-CM

## 2020-11-13 DIAGNOSIS — H353122 Nonexudative age-related macular degeneration, left eye, intermediate dry stage: Secondary | ICD-10-CM

## 2020-11-13 DIAGNOSIS — H43813 Vitreous degeneration, bilateral: Secondary | ICD-10-CM | POA: Diagnosis not present

## 2020-11-13 DIAGNOSIS — H35033 Hypertensive retinopathy, bilateral: Secondary | ICD-10-CM

## 2020-11-13 DIAGNOSIS — I10 Essential (primary) hypertension: Secondary | ICD-10-CM | POA: Diagnosis not present

## 2020-11-13 DIAGNOSIS — H2512 Age-related nuclear cataract, left eye: Secondary | ICD-10-CM | POA: Diagnosis not present

## 2020-11-30 DIAGNOSIS — E1151 Type 2 diabetes mellitus with diabetic peripheral angiopathy without gangrene: Secondary | ICD-10-CM | POA: Diagnosis not present

## 2020-11-30 DIAGNOSIS — Z125 Encounter for screening for malignant neoplasm of prostate: Secondary | ICD-10-CM | POA: Diagnosis not present

## 2020-11-30 DIAGNOSIS — Z0000 Encounter for general adult medical examination without abnormal findings: Secondary | ICD-10-CM | POA: Diagnosis not present

## 2020-11-30 DIAGNOSIS — E785 Hyperlipidemia, unspecified: Secondary | ICD-10-CM | POA: Diagnosis not present

## 2020-11-30 DIAGNOSIS — E039 Hypothyroidism, unspecified: Secondary | ICD-10-CM | POA: Diagnosis not present

## 2020-12-07 DIAGNOSIS — Z7901 Long term (current) use of anticoagulants: Secondary | ICD-10-CM | POA: Diagnosis not present

## 2020-12-07 DIAGNOSIS — I4821 Permanent atrial fibrillation: Secondary | ICD-10-CM | POA: Diagnosis not present

## 2020-12-07 DIAGNOSIS — I739 Peripheral vascular disease, unspecified: Secondary | ICD-10-CM | POA: Diagnosis not present

## 2020-12-07 DIAGNOSIS — N529 Male erectile dysfunction, unspecified: Secondary | ICD-10-CM | POA: Diagnosis not present

## 2020-12-07 DIAGNOSIS — R82998 Other abnormal findings in urine: Secondary | ICD-10-CM | POA: Diagnosis not present

## 2020-12-07 DIAGNOSIS — E785 Hyperlipidemia, unspecified: Secondary | ICD-10-CM | POA: Diagnosis not present

## 2020-12-07 DIAGNOSIS — Z0000 Encounter for general adult medical examination without abnormal findings: Secondary | ICD-10-CM | POA: Diagnosis not present

## 2020-12-07 DIAGNOSIS — H3530 Unspecified macular degeneration: Secondary | ICD-10-CM | POA: Diagnosis not present

## 2020-12-07 DIAGNOSIS — E039 Hypothyroidism, unspecified: Secondary | ICD-10-CM | POA: Diagnosis not present

## 2020-12-07 DIAGNOSIS — E1151 Type 2 diabetes mellitus with diabetic peripheral angiopathy without gangrene: Secondary | ICD-10-CM | POA: Diagnosis not present

## 2020-12-09 ENCOUNTER — Other Ambulatory Visit: Payer: Self-pay | Admitting: Cardiovascular Disease

## 2020-12-10 DIAGNOSIS — Z1212 Encounter for screening for malignant neoplasm of rectum: Secondary | ICD-10-CM | POA: Diagnosis not present

## 2020-12-18 ENCOUNTER — Encounter (INDEPENDENT_AMBULATORY_CARE_PROVIDER_SITE_OTHER): Payer: Medicare Other | Admitting: Ophthalmology

## 2020-12-18 ENCOUNTER — Other Ambulatory Visit: Payer: Self-pay

## 2020-12-18 DIAGNOSIS — H2512 Age-related nuclear cataract, left eye: Secondary | ICD-10-CM

## 2020-12-18 DIAGNOSIS — H35033 Hypertensive retinopathy, bilateral: Secondary | ICD-10-CM

## 2020-12-18 DIAGNOSIS — I10 Essential (primary) hypertension: Secondary | ICD-10-CM | POA: Diagnosis not present

## 2020-12-18 DIAGNOSIS — H338 Other retinal detachments: Secondary | ICD-10-CM

## 2020-12-18 DIAGNOSIS — H4423 Degenerative myopia, bilateral: Secondary | ICD-10-CM

## 2020-12-18 DIAGNOSIS — H353211 Exudative age-related macular degeneration, right eye, with active choroidal neovascularization: Secondary | ICD-10-CM | POA: Diagnosis not present

## 2020-12-18 DIAGNOSIS — H353122 Nonexudative age-related macular degeneration, left eye, intermediate dry stage: Secondary | ICD-10-CM | POA: Diagnosis not present

## 2020-12-18 DIAGNOSIS — H43813 Vitreous degeneration, bilateral: Secondary | ICD-10-CM

## 2020-12-26 ENCOUNTER — Other Ambulatory Visit: Payer: Self-pay | Admitting: Cardiovascular Disease

## 2020-12-30 ENCOUNTER — Encounter: Payer: Self-pay | Admitting: Cardiovascular Disease

## 2020-12-30 NOTE — Progress Notes (Signed)
Cardiology Office Note   Date:  12/31/2020   ID:  Craig Simon, DOB 25-Apr-1941, MRN [id number]  PCP:  Leanna Battles, MD  Cardiologist:   Mertie Moores, MD   Chief Complaint  Patient presents with   Atrial Fibrillation   Congestive Heart Failure        1. Atrial Fibrillation 2. Hypothyroidism 3. Diabetes mellitus 4. Hyperlipidemia 5. Obstructive sleep apnea- wears CPAP at night   Craig Simon is a 80 year old gentleman with a history of atrial fibrillation. He's done quite well from a cardiac standpoint. He's not had any episodes of chest pain or shortness of breath. He's been able to do all of his normal activities without any problems.  He has noticed some leg edema.  He has not been eating much salt.  October 07, 2013:  Craig Simon is doing well.    He is able to do all of his normal activities without any significant problems. He denies any chest pain or shortness breath.   October 16, 2014:  Craig Simon is a 80 y.o. male who presents for f/u of his atrial fib   No CP , no dyspnea. Still very active.[name]   October 18, 2015:  Staying very active.    BP and HR are great.   No CP or dyspena Going canoeing this Saturday.   October 22, 2016:    Craig Simon  seen back today for further evaluation of his chronic atrial fibrillation   Under lots of stress .   Nov 20, 2017:  Craig Simon is seen back today for follow up office visit  For his atrial fib  No CP or dyspnea.     December 07, 2019: Craig Simon is seen back today for follow-up of his atrial fibrillation.  His last echocardiogram was in 2016 which reveals normal left ventricular systolic function.  He has severe dilatation of the both atrium.  He has moderate tricuspid regurgitation.  PA pressure estimated at 37 mmHg.  His insurance company wants him to switch from Eliquis to Treutlen.  Apparently it it is the formulary medication for his insurance. No hematuria , no hx of GI bleeding   No cp or dyspnea.   December 31, 2020: Craig Simon is seen today for follow  up of his Afib. He has moderate TR Normal LV function Mild PA HTN  HR is slow  Labs from Dr. Shon Baton office showed total cholesterol of 109 Triglyceride level is 90 HDL is 50 LDL is 41    Past Medical History:  Diagnosis Date   Allergic rhinitis    Chest pain    CHF (congestive heart failure) (HCC)    Chronic anticoagulation    Chronic atrial fibrillation ([facility])    Diabetes mellitus    Hyperlipidemia    Hypothyroidism    Internal hemorrhoids without mention of complication 71/12/2692   Leg edema    Macular degeneration    Mitral regurgitation    Sleep apnea    has cpap but cannot tolerate    Past Surgical History:  Procedure Laterality Date   ADENOIDECTOMY  1949   CARDIAC CATHETERIZATION  11/03/2000   EF 40%   CARDIOVASCULAR STRESS TEST  09/20/2004   EF 56%. NO EVIDENCE OF ISCHEMIA   cornea surgery  2009   right eye   DOPPLER ECHOCARDIOGRAPHY  03/09/2001   EF 50%. MODERATE LVH   EYE SURGERY  2007   right eye; detached retina   HAMMER TOE SURGERY  07/20/12   right   TURBINATE REDUCTION  1989   US ECHOCARDIOGRAPHY  09/10/2004   EF 55-60%     Current Outpatient Medications  Medication Sig Dispense Refill   Bevacizumab (AVASTIN) 100 MG/4ML SOLN Inject into the vein as directed.      ezetimibe (ZETIA) 10 MG tablet Take 10 mg by mouth daily.       fish oil-omega-3 fatty acids 1000 MG capsule Take 2 g by mouth 2 (two) times daily.       fluticasone (FLONASE) 50 MCG/ACT nasal spray Place 2 sprays into the nose as needed for rhinitis. 1 g 12   furosemide (LASIX) 40 MG tablet TAKE 1 TABLET (40 MG TOTAL) BY MOUTH 3 (THREE) TIMES A WEEK. 45 tablet 2   levothyroxine (SYNTHROID, LEVOTHROID) 150 MCG tablet Take 150 mcg by mouth daily.       lisinopril (ZESTRIL) 5 MG tablet TAKE 1 TABLET BY MOUTH EVERY DAY 90 tablet 3   metFORMIN (GLUCOPHAGE) 500 MG tablet Take 500 mg by mouth at bedtime.       Multiple Vitamin (MULTIVITAMIN) tablet Take 1 tablet by mouth daily.        multivitamin-lutein (OCUVITE-LUTEIN) CAPS Take 1 capsule by mouth daily.       pioglitazone (ACTOS) 15 MG tablet Take 15 mg by mouth daily.       potassium chloride SA (KLOR-CON M20) 20 MEQ tablet TAKE 1 TABLET BY MOUTH TWICE A DAY ON MONDAY, WEDNESDAY, AND FRIDAY 80 tablet 0   rosuvastatin (CRESTOR) 20 MG tablet Take 20 mg by mouth daily.       SF 1.1 % GEL dental gel Place 1 application onto teeth at bedtime.      rivaroxaban (XARELTO) 20 MG TABS tablet Take 1 tablet (20 mg total) by mouth daily with supper. 90 tablet 3   No current facility-administered medications for this visit.    Allergies:   Patient has no known allergies.    Social History:  The patient  reports that he has quit smoking. His smoking use included cigars and pipe. He has never used smokeless tobacco. He reports current alcohol use. He reports that he does not use drugs.   Family History:  The patient's family history includes Heart disease in his brother, father, and mother.    ROS:   Noted in current history, otherwise review of systems is negative. Physical Exam: Blood pressure 132/60, pulse (!) 47, height 6' 1.5" (1.867 m), weight 187 lb 6.4 oz (85 kg).  GEN:  Well nourished, well developed in no acute distress HEENT: Normal NECK: No JVD; No carotid bruits LYMPHATICS: No lymphadenopathy CARDIAC:  Irreg. Irreg. , no murmurs, rubs, gallops RESPIRATORY:  Clear to auscultation without rales, wheezing or rhonchi  ABDOMEN: Soft, non-tender, non-distended MUSCULOSKELETAL:  No edema; No deformity  SKIN: Warm and dry NEUROLOGIC:  Alert and oriented x 3   EKG:   December 31, 2020: Atrial fibrillation with a slow ventricular response at 47.  No ST or T wave changes.  Recent Labs: No results found for requested labs within last 8760 hours.    Lipid Panel    Component Value Date/Time   CHOL 101 05/07/2011 1117   TRIG 119.0 05/07/2011 1117   HDL 48.10 05/07/2011 1117   CHOLHDL 2 05/07/2011 1117   VLDL 23.8  05/07/2011 1117   LDLCALC 29 05/07/2011 1117      Wt Readings from Last 3 Encounters:  12/31/20 187 lb 6.4 oz (85 kg)  03/28/20 190 lb (86.2 kg)  02/02/20 190 lb  9.6 oz (86.5 kg)      Other studies Reviewed: Additional studies/ records that were reviewed today include: INR levesl . Review of the above records demonstrates: he has been theraputic  For his INR    ASSESSMENT AND PLAN:  1.  Atrial fibrillation: He has chronic atrial fibrillation.   HR is a bit slow Will DC metoprolol Echo in 1 year Will see him in a year.    2.  Hyperlipidemia: Continue current medications.  Recent labs from Dr. Shon Baton office look great.   Current medicines are reviewed at length with the patient today.  The patient does not have concerns regarding medicines.  The following changes have been made:  See above   Labs/ tests ordered today include:   Orders Placed This Encounter  Procedures   EKG 12-Lead   ECHOCARDIOGRAM COMPLETE      Disposition:   FU with me in 1 year    Signed, Mertie Moores, MD  12/31/2020 10:37 AM    [facility] [place], [place], [place]  [address] Phone: [phone]; Fax: [phone]

## 2020-12-31 ENCOUNTER — Other Ambulatory Visit: Payer: Self-pay

## 2020-12-31 ENCOUNTER — Encounter: Payer: Self-pay | Admitting: Cardiovascular Disease

## 2020-12-31 ENCOUNTER — Ambulatory Visit (INDEPENDENT_AMBULATORY_CARE_PROVIDER_SITE_OTHER): Payer: Medicare Other | Admitting: Cardiovascular Disease

## 2020-12-31 VITALS — BP 132/60 | HR 47 | Ht 73.5 in | Wt 187.4 lb

## 2020-12-31 DIAGNOSIS — I482 Chronic atrial fibrillation, unspecified: Secondary | ICD-10-CM | POA: Diagnosis not present

## 2020-12-31 DIAGNOSIS — I509 Heart failure, unspecified: Secondary | ICD-10-CM | POA: Diagnosis not present

## 2020-12-31 MED ORDER — RIVAROXABAN 20 MG PO TABS: 20.0000 mg | ORAL_TABLET | Freq: Every day | ORAL | 3 refills | Status: DC

## 2020-12-31 NOTE — Patient Instructions (Signed)
Medication Instructions:  Your physician has recommended you make the following change in your medication:   Stop Metoprolol. Please call the office if you notice your heart rate increasing at rest on a regular basis.  Labwork: None ordered.  Testing/Procedures: Your physician has requested that you have an echocardiogram. Echocardiography is a painless test that uses sound waves to create images of your heart. It provides your doctor with information about the size and shape of your heart and how well your heart's chambers and valves are working. This procedure takes approximately one hour. There are no restrictions for this procedure.  Follow-Up: 12 months with Dr. Acie Fredrickson  Any Other Special Instructions Will Be Listed Below (If Applicable).     If you need a refill on your cardiac medications before your next appointment, please call your pharmacy.

## 2021-01-08 NOTE — Addendum Note (Signed)
Addended by: Patterson Hammersmith A on: 01/08/2021 04:08 PM   Modules accepted: Orders

## 2021-01-22 ENCOUNTER — Other Ambulatory Visit: Payer: Self-pay

## 2021-01-22 ENCOUNTER — Encounter (INDEPENDENT_AMBULATORY_CARE_PROVIDER_SITE_OTHER): Payer: Medicare Other | Admitting: Ophthalmology

## 2021-01-22 DIAGNOSIS — H4423 Degenerative myopia, bilateral: Secondary | ICD-10-CM | POA: Diagnosis not present

## 2021-01-22 DIAGNOSIS — I10 Essential (primary) hypertension: Secondary | ICD-10-CM | POA: Diagnosis not present

## 2021-01-22 DIAGNOSIS — H353122 Nonexudative age-related macular degeneration, left eye, intermediate dry stage: Secondary | ICD-10-CM

## 2021-01-22 DIAGNOSIS — H35033 Hypertensive retinopathy, bilateral: Secondary | ICD-10-CM

## 2021-01-22 DIAGNOSIS — H338 Other retinal detachments: Secondary | ICD-10-CM | POA: Diagnosis not present

## 2021-01-22 DIAGNOSIS — H353211 Exudative age-related macular degeneration, right eye, with active choroidal neovascularization: Secondary | ICD-10-CM

## 2021-01-22 DIAGNOSIS — H43813 Vitreous degeneration, bilateral: Secondary | ICD-10-CM

## 2021-02-26 ENCOUNTER — Encounter (INDEPENDENT_AMBULATORY_CARE_PROVIDER_SITE_OTHER): Payer: Medicare Other | Admitting: Ophthalmology

## 2021-02-26 ENCOUNTER — Other Ambulatory Visit: Payer: Self-pay

## 2021-02-26 DIAGNOSIS — H43813 Vitreous degeneration, bilateral: Secondary | ICD-10-CM | POA: Diagnosis not present

## 2021-02-26 DIAGNOSIS — H353211 Exudative age-related macular degeneration, right eye, with active choroidal neovascularization: Secondary | ICD-10-CM | POA: Diagnosis not present

## 2021-02-26 DIAGNOSIS — H35033 Hypertensive retinopathy, bilateral: Secondary | ICD-10-CM

## 2021-02-26 DIAGNOSIS — H353122 Nonexudative age-related macular degeneration, left eye, intermediate dry stage: Secondary | ICD-10-CM

## 2021-02-26 DIAGNOSIS — I10 Essential (primary) hypertension: Secondary | ICD-10-CM | POA: Diagnosis not present

## 2021-02-26 DIAGNOSIS — H4423 Degenerative myopia, bilateral: Secondary | ICD-10-CM | POA: Diagnosis not present

## 2021-02-26 DIAGNOSIS — H338 Other retinal detachments: Secondary | ICD-10-CM

## 2021-03-21 ENCOUNTER — Other Ambulatory Visit: Payer: Self-pay | Admitting: Cardiovascular Disease

## 2021-03-21 DIAGNOSIS — Z23 Encounter for immunization: Secondary | ICD-10-CM | POA: Diagnosis not present

## 2021-03-21 MED ORDER — POTASSIUM CHLORIDE CRYS ER 20 MEQ PO TBCR: EXTENDED_RELEASE_TABLET | ORAL | 3 refills | Status: DC

## 2021-03-26 ENCOUNTER — Encounter (INDEPENDENT_AMBULATORY_CARE_PROVIDER_SITE_OTHER): Payer: Medicare Other | Admitting: Ophthalmology

## 2021-03-26 ENCOUNTER — Other Ambulatory Visit: Payer: Self-pay

## 2021-03-26 DIAGNOSIS — I10 Essential (primary) hypertension: Secondary | ICD-10-CM

## 2021-03-26 DIAGNOSIS — H43813 Vitreous degeneration, bilateral: Secondary | ICD-10-CM | POA: Diagnosis not present

## 2021-03-26 DIAGNOSIS — H353211 Exudative age-related macular degeneration, right eye, with active choroidal neovascularization: Secondary | ICD-10-CM | POA: Diagnosis not present

## 2021-03-26 DIAGNOSIS — H4423 Degenerative myopia, bilateral: Secondary | ICD-10-CM

## 2021-03-26 DIAGNOSIS — H338 Other retinal detachments: Secondary | ICD-10-CM | POA: Diagnosis not present

## 2021-03-26 DIAGNOSIS — H35033 Hypertensive retinopathy, bilateral: Secondary | ICD-10-CM

## 2021-03-26 DIAGNOSIS — H353122 Nonexudative age-related macular degeneration, left eye, intermediate dry stage: Secondary | ICD-10-CM | POA: Diagnosis not present

## 2021-04-06 ENCOUNTER — Other Ambulatory Visit: Payer: Self-pay | Admitting: Cardiovascular Disease

## 2021-04-30 ENCOUNTER — Encounter (INDEPENDENT_AMBULATORY_CARE_PROVIDER_SITE_OTHER): Payer: Medicare Other | Admitting: Ophthalmology

## 2021-04-30 ENCOUNTER — Other Ambulatory Visit: Payer: Self-pay

## 2021-04-30 DIAGNOSIS — H353211 Exudative age-related macular degeneration, right eye, with active choroidal neovascularization: Secondary | ICD-10-CM | POA: Diagnosis not present

## 2021-04-30 DIAGNOSIS — H43813 Vitreous degeneration, bilateral: Secondary | ICD-10-CM

## 2021-04-30 DIAGNOSIS — I10 Essential (primary) hypertension: Secondary | ICD-10-CM | POA: Diagnosis not present

## 2021-04-30 DIAGNOSIS — H338 Other retinal detachments: Secondary | ICD-10-CM

## 2021-04-30 DIAGNOSIS — H353122 Nonexudative age-related macular degeneration, left eye, intermediate dry stage: Secondary | ICD-10-CM | POA: Diagnosis not present

## 2021-04-30 DIAGNOSIS — H4423 Degenerative myopia, bilateral: Secondary | ICD-10-CM

## 2021-04-30 DIAGNOSIS — H35033 Hypertensive retinopathy, bilateral: Secondary | ICD-10-CM

## 2021-05-04 DIAGNOSIS — Z23 Encounter for immunization: Secondary | ICD-10-CM | POA: Diagnosis not present

## 2021-06-04 ENCOUNTER — Other Ambulatory Visit: Payer: Self-pay

## 2021-06-04 ENCOUNTER — Encounter (INDEPENDENT_AMBULATORY_CARE_PROVIDER_SITE_OTHER): Payer: Medicare Other | Admitting: Ophthalmology

## 2021-06-04 DIAGNOSIS — H353211 Exudative age-related macular degeneration, right eye, with active choroidal neovascularization: Secondary | ICD-10-CM | POA: Diagnosis not present

## 2021-06-04 DIAGNOSIS — H43813 Vitreous degeneration, bilateral: Secondary | ICD-10-CM | POA: Diagnosis not present

## 2021-06-04 DIAGNOSIS — H35033 Hypertensive retinopathy, bilateral: Secondary | ICD-10-CM | POA: Diagnosis not present

## 2021-06-04 DIAGNOSIS — H338 Other retinal detachments: Secondary | ICD-10-CM | POA: Diagnosis not present

## 2021-06-04 DIAGNOSIS — I10 Essential (primary) hypertension: Secondary | ICD-10-CM | POA: Diagnosis not present

## 2021-06-04 DIAGNOSIS — H4423 Degenerative myopia, bilateral: Secondary | ICD-10-CM

## 2021-06-04 DIAGNOSIS — H353122 Nonexudative age-related macular degeneration, left eye, intermediate dry stage: Secondary | ICD-10-CM

## 2021-07-11 ENCOUNTER — Encounter (INDEPENDENT_AMBULATORY_CARE_PROVIDER_SITE_OTHER): Payer: Medicare Other | Admitting: Ophthalmology

## 2021-07-11 ENCOUNTER — Other Ambulatory Visit: Payer: Self-pay

## 2021-07-11 DIAGNOSIS — H338 Other retinal detachments: Secondary | ICD-10-CM | POA: Diagnosis not present

## 2021-07-11 DIAGNOSIS — I10 Essential (primary) hypertension: Secondary | ICD-10-CM | POA: Diagnosis not present

## 2021-07-11 DIAGNOSIS — H4423 Degenerative myopia, bilateral: Secondary | ICD-10-CM | POA: Diagnosis not present

## 2021-07-11 DIAGNOSIS — H35033 Hypertensive retinopathy, bilateral: Secondary | ICD-10-CM | POA: Diagnosis not present

## 2021-07-11 DIAGNOSIS — H353231 Exudative age-related macular degeneration, bilateral, with active choroidal neovascularization: Secondary | ICD-10-CM | POA: Diagnosis not present

## 2021-07-11 DIAGNOSIS — H43813 Vitreous degeneration, bilateral: Secondary | ICD-10-CM

## 2021-07-16 ENCOUNTER — Encounter (INDEPENDENT_AMBULATORY_CARE_PROVIDER_SITE_OTHER): Payer: Medicare Other | Admitting: Ophthalmology

## 2021-07-16 ENCOUNTER — Other Ambulatory Visit: Payer: Self-pay

## 2021-07-16 DIAGNOSIS — H353221 Exudative age-related macular degeneration, left eye, with active choroidal neovascularization: Secondary | ICD-10-CM | POA: Diagnosis not present

## 2021-08-15 ENCOUNTER — Encounter (INDEPENDENT_AMBULATORY_CARE_PROVIDER_SITE_OTHER): Payer: Medicare Other | Admitting: Ophthalmology

## 2021-08-15 ENCOUNTER — Other Ambulatory Visit: Payer: Self-pay

## 2021-08-15 DIAGNOSIS — H338 Other retinal detachments: Secondary | ICD-10-CM | POA: Diagnosis not present

## 2021-08-15 DIAGNOSIS — H35033 Hypertensive retinopathy, bilateral: Secondary | ICD-10-CM | POA: Diagnosis not present

## 2021-08-15 DIAGNOSIS — H2512 Age-related nuclear cataract, left eye: Secondary | ICD-10-CM | POA: Diagnosis not present

## 2021-08-15 DIAGNOSIS — I10 Essential (primary) hypertension: Secondary | ICD-10-CM | POA: Diagnosis not present

## 2021-08-15 DIAGNOSIS — H4423 Degenerative myopia, bilateral: Secondary | ICD-10-CM

## 2021-08-15 DIAGNOSIS — H353231 Exudative age-related macular degeneration, bilateral, with active choroidal neovascularization: Secondary | ICD-10-CM

## 2021-08-15 DIAGNOSIS — H43813 Vitreous degeneration, bilateral: Secondary | ICD-10-CM | POA: Diagnosis not present

## 2021-08-20 ENCOUNTER — Other Ambulatory Visit: Payer: Self-pay

## 2021-08-20 ENCOUNTER — Encounter (INDEPENDENT_AMBULATORY_CARE_PROVIDER_SITE_OTHER): Payer: Medicare Other | Admitting: Ophthalmology

## 2021-08-20 DIAGNOSIS — H353221 Exudative age-related macular degeneration, left eye, with active choroidal neovascularization: Secondary | ICD-10-CM

## 2021-09-17 ENCOUNTER — Encounter (INDEPENDENT_AMBULATORY_CARE_PROVIDER_SITE_OTHER): Payer: Medicare Other | Admitting: Ophthalmology

## 2021-09-17 ENCOUNTER — Other Ambulatory Visit: Payer: Self-pay

## 2021-09-17 DIAGNOSIS — H353231 Exudative age-related macular degeneration, bilateral, with active choroidal neovascularization: Secondary | ICD-10-CM

## 2021-09-17 DIAGNOSIS — I10 Essential (primary) hypertension: Secondary | ICD-10-CM

## 2021-09-17 DIAGNOSIS — H2512 Age-related nuclear cataract, left eye: Secondary | ICD-10-CM | POA: Diagnosis not present

## 2021-09-17 DIAGNOSIS — H4423 Degenerative myopia, bilateral: Secondary | ICD-10-CM | POA: Diagnosis not present

## 2021-09-17 DIAGNOSIS — H338 Other retinal detachments: Secondary | ICD-10-CM

## 2021-09-17 DIAGNOSIS — H35033 Hypertensive retinopathy, bilateral: Secondary | ICD-10-CM | POA: Diagnosis not present

## 2021-09-17 DIAGNOSIS — H43813 Vitreous degeneration, bilateral: Secondary | ICD-10-CM

## 2021-09-24 ENCOUNTER — Other Ambulatory Visit: Payer: Self-pay

## 2021-09-24 ENCOUNTER — Encounter (INDEPENDENT_AMBULATORY_CARE_PROVIDER_SITE_OTHER): Payer: Medicare Other | Admitting: Ophthalmology

## 2021-09-24 DIAGNOSIS — H353221 Exudative age-related macular degeneration, left eye, with active choroidal neovascularization: Secondary | ICD-10-CM | POA: Diagnosis not present

## 2021-10-18 ENCOUNTER — Other Ambulatory Visit: Payer: Self-pay | Admitting: Cardiovascular Disease

## 2021-10-22 ENCOUNTER — Encounter (INDEPENDENT_AMBULATORY_CARE_PROVIDER_SITE_OTHER): Payer: Medicare Other | Admitting: Ophthalmology

## 2021-10-22 DIAGNOSIS — I10 Essential (primary) hypertension: Secondary | ICD-10-CM

## 2021-10-22 DIAGNOSIS — H43813 Vitreous degeneration, bilateral: Secondary | ICD-10-CM | POA: Diagnosis not present

## 2021-10-22 DIAGNOSIS — H338 Other retinal detachments: Secondary | ICD-10-CM | POA: Diagnosis not present

## 2021-10-22 DIAGNOSIS — H353231 Exudative age-related macular degeneration, bilateral, with active choroidal neovascularization: Secondary | ICD-10-CM | POA: Diagnosis not present

## 2021-10-22 DIAGNOSIS — H4423 Degenerative myopia, bilateral: Secondary | ICD-10-CM

## 2021-10-22 DIAGNOSIS — H35033 Hypertensive retinopathy, bilateral: Secondary | ICD-10-CM

## 2021-10-29 ENCOUNTER — Encounter (INDEPENDENT_AMBULATORY_CARE_PROVIDER_SITE_OTHER): Payer: Medicare Other | Admitting: Ophthalmology

## 2021-10-29 DIAGNOSIS — H353221 Exudative age-related macular degeneration, left eye, with active choroidal neovascularization: Secondary | ICD-10-CM

## 2021-11-26 ENCOUNTER — Encounter (INDEPENDENT_AMBULATORY_CARE_PROVIDER_SITE_OTHER): Payer: Medicare Other | Admitting: Ophthalmology

## 2021-11-26 DIAGNOSIS — I10 Essential (primary) hypertension: Secondary | ICD-10-CM

## 2021-11-26 DIAGNOSIS — H4423 Degenerative myopia, bilateral: Secondary | ICD-10-CM

## 2021-11-26 DIAGNOSIS — H35033 Hypertensive retinopathy, bilateral: Secondary | ICD-10-CM | POA: Diagnosis not present

## 2021-11-26 DIAGNOSIS — H43813 Vitreous degeneration, bilateral: Secondary | ICD-10-CM | POA: Diagnosis not present

## 2021-11-26 DIAGNOSIS — H353231 Exudative age-related macular degeneration, bilateral, with active choroidal neovascularization: Secondary | ICD-10-CM

## 2021-11-26 DIAGNOSIS — H2512 Age-related nuclear cataract, left eye: Secondary | ICD-10-CM | POA: Diagnosis not present

## 2021-11-26 DIAGNOSIS — H338 Other retinal detachments: Secondary | ICD-10-CM | POA: Diagnosis not present

## 2021-12-03 ENCOUNTER — Encounter (INDEPENDENT_AMBULATORY_CARE_PROVIDER_SITE_OTHER): Payer: Medicare Other | Admitting: Ophthalmology

## 2021-12-03 DIAGNOSIS — H353221 Exudative age-related macular degeneration, left eye, with active choroidal neovascularization: Secondary | ICD-10-CM

## 2021-12-17 ENCOUNTER — Other Ambulatory Visit: Payer: Self-pay | Admitting: Cardiovascular Disease

## 2021-12-24 ENCOUNTER — Ambulatory Visit (HOSPITAL_COMMUNITY): Payer: Medicare Other | Attending: Internal Medicine

## 2021-12-24 DIAGNOSIS — I509 Heart failure, unspecified: Secondary | ICD-10-CM | POA: Diagnosis not present

## 2021-12-24 DIAGNOSIS — I482 Chronic atrial fibrillation, unspecified: Secondary | ICD-10-CM | POA: Insufficient documentation

## 2021-12-24 LAB — ECHOCARDIOGRAM COMPLETE: S' Lateral: 3.4 cm

## 2021-12-31 ENCOUNTER — Ambulatory Visit (INDEPENDENT_AMBULATORY_CARE_PROVIDER_SITE_OTHER): Payer: Medicare Other | Admitting: Cardiovascular Disease

## 2021-12-31 ENCOUNTER — Encounter: Payer: Self-pay | Admitting: Cardiovascular Disease

## 2021-12-31 ENCOUNTER — Encounter (INDEPENDENT_AMBULATORY_CARE_PROVIDER_SITE_OTHER): Payer: Medicare Other | Admitting: Ophthalmology

## 2021-12-31 VITALS — BP 134/74 | HR 80 | Ht 73.5 in | Wt 179.2 lb

## 2021-12-31 DIAGNOSIS — I4891 Unspecified atrial fibrillation: Secondary | ICD-10-CM | POA: Diagnosis not present

## 2021-12-31 DIAGNOSIS — I509 Heart failure, unspecified: Secondary | ICD-10-CM

## 2021-12-31 NOTE — Progress Notes (Signed)
Cardiology Office Note   Date:  12/31/2021   ID:  Craig Simon, DOB 1940-09-14, MRN [id number]  PCP:  Garlan Fillers, MD  Cardiologist:   Kristeen Miss, MD   Chief Complaint  Patient presents with   Atrial Fibrillation        Congestive Heart Failure        1. Atrial Fibrillation 2. Hypothyroidism 3. Diabetes mellitus 4. Hyperlipidemia 5. Obstructive sleep apnea- wears CPAP at night   Craig Simon is a 81 year old gentleman with a history of atrial fibrillation. He's done quite well from a cardiac standpoint. He's not had any episodes of chest pain or shortness of breath. He's been able to do all of his normal activities without any problems.  He has noticed some leg edema.  He has not been eating much salt.  October 07, 2013:  Craig Simon is doing well.    He is able to do all of his normal activities without any significant problems. He denies any chest pain or shortness breath.   October 16, 2014:  Craig Simon is a 81 y.o. male who presents for f/u of his atrial fib   No CP , no dyspnea. Still very active.[name]   October 18, 2015:  Staying very active.    BP and HR are great.   No CP or dyspena Going canoeing this Saturday.   October 22, 2016:    Craig Simon  seen back today for further evaluation of his chronic atrial fibrillation   Under lots of stress .   Nov 20, 2017:  Craig Simon is seen back today for follow up office visit  For his atrial fib  No CP or dyspnea.     December 07, 2019: Craig Simon is seen back today for follow-up of his atrial fibrillation.  His last echocardiogram was in 2016 which reveals normal left ventricular systolic function.  He has severe dilatation of the both atrium.  He has moderate tricuspid regurgitation.  PA pressure estimated at 37 mmHg.  His insurance company wants him to switch from Eliquis to Xarelto.  Apparently it it is the formulary medication for his insurance. No hematuria , no hx of GI bleeding   No cp or dyspnea.   December 31, 2020: Craig Simon is seen today  for follow up of his Afib. He has moderate TR Normal LV function Mild PA HTN  HR is slow  Labs from Dr. Silvano Rusk office showed total cholesterol of 109 Triglyceride level is 90 HDL is 50 LDL is 41  December 31, 2021 Craig Simon is seen today for follow up visit for his atrial fibrillation and congestive heart failure.  Recent echocardiogram from December 24, 2021 reveals normal left ventricular systolic function.  His EF is 50 to 55%.  We were not able to evaluate his diastolic function.  His RV is mildly enlarged and his pulmonary artery pressures were mildly elevated.  He has severe biatrial enlargement.  He has mild mitral regurgitation.  he has moderate tricuspid regurgitation.  Pulmonary artery pressures have been mildly elevated.  Was recently walking in the woods.   Had wound from a broken stick  Dr. Eloise Harman sent in a prescription for doxycycline He has not had a doctor look at it yet.   Left lower leg    Slightly firm, slightly warm,  no drainage, no residual dranage No bone tenderness  Past Medical History:  Diagnosis Date   Allergic rhinitis    Chest pain    CHF (congestive heart failure) (HCC)  Chronic anticoagulation    Chronic atrial fibrillation (HCC)    Diabetes mellitus    Hyperlipidemia    Hypothyroidism    Internal hemorrhoids without mention of complication 04/06/2001   Leg edema    Macular degeneration    Mitral regurgitation    Sleep apnea    has cpap but cannot tolerate    Past Surgical History:  Procedure Laterality Date   ADENOIDECTOMY  1949   CARDIAC CATHETERIZATION  11/03/2000   EF 40%   CARDIOVASCULAR STRESS TEST  09/20/2004   EF 56%. NO EVIDENCE OF ISCHEMIA   cornea surgery  2009   right eye   DOPPLER ECHOCARDIOGRAPHY  03/09/2001   EF 50%. MODERATE LVH   EYE SURGERY  2007   right eye; detached retina   HAMMER TOE SURGERY  07/20/12   right   TURBINATE REDUCTION  1989   US ECHOCARDIOGRAPHY  09/10/2004   EF 55-60%     Current Outpatient  Medications  Medication Sig Dispense Refill   Bevacizumab (AVASTIN) 100 MG/4ML SOLN Inject into the vein as directed.      ciprofloxacin (CILOXAN) 0.3 % ophthalmic solution INSTILL ONE DROP INTO BOTH EYES 4 TIMES A DAY FOR TWO DAYS AFTER EACH MONTHLY EYE INJECTION     ezetimibe (ZETIA) 10 MG tablet Take 10 mg by mouth daily.       fish oil-omega-3 fatty acids 1000 MG capsule Take 2 g by mouth 2 (two) times daily.       fluticasone (FLONASE) 50 MCG/ACT nasal spray Place 2 sprays into the nose as needed for rhinitis. 1 g 12   furosemide (LASIX) 40 MG tablet TAKE 1 TABLET BY MOUTH 3 TIMES A WEEK 15 tablet 0   levothyroxine (SYNTHROID, LEVOTHROID) 150 MCG tablet Take 150 mcg by mouth daily.       lisinopril (ZESTRIL) 5 MG tablet TAKE 1 TABLET BY MOUTH EVERY DAY 90 tablet 0   metFORMIN (GLUCOPHAGE-XR) 500 MG 24 hr tablet Take 1 tablet by mouth every evening.     Multiple Vitamin (MULTIVITAMIN) tablet Take 1 tablet by mouth daily.       multivitamin-lutein (OCUVITE-LUTEIN) CAPS Take 1 capsule by mouth daily.       pioglitazone (ACTOS) 15 MG tablet Take 15 mg by mouth daily.       potassium chloride SA (KLOR-CON M20) 20 MEQ tablet TAKE 1 TABLET BY MOUTH TWICE A DAY ON MONDAY, WEDNESDAY, AND FRIDAY 80 tablet 3   rivaroxaban (XARELTO) 20 MG TABS tablet Take 1 tablet (20 mg total) by mouth daily with supper. 90 tablet 3   rosuvastatin (CRESTOR) 20 MG tablet Take 20 mg by mouth daily.       SF 1.1 % GEL dental gel Place 1 application onto teeth at bedtime.      No current facility-administered medications for this visit.    Allergies:   Patient has no known allergies.    Social History:  The patient  reports that he has quit smoking. His smoking use included cigars and pipe. He has never used smokeless tobacco. He reports current alcohol use. He reports that he does not use drugs.   Family History:  The patient's family history includes Heart disease in his brother, father, and mother.    ROS:    Noted in current history, otherwise review of systems is negative.   Physical Exam: There were no vitals taken for this visit.  GEN:  Well nourished, well developed in no acute distress HEENT: Normal  NECK: No JVD; No carotid bruits LYMPHATICS: No lymphadenopathy CARDIAC: irreg. Irreg.  RESPIRATORY:  Clear to auscultation without rales, wheezing or rhonchi  ABDOMEN: Soft, non-tender, non-distended MUSCULOSKELETAL:  No edema; No deformity  SKIN: Warm and dry NEUROLOGIC:  Alert and oriented x 3   EKG:   December 31, 2021   Atrial Fib at 81.   Rare aberrent conducted beats   Recent Labs: No results found for requested labs within last 365 days.    Lipid Panel    Component Value Date/Time   CHOL 101 05/07/2011 1117   TRIG 119.0 05/07/2011 1117   HDL 48.10 05/07/2011 1117   CHOLHDL 2 05/07/2011 1117   VLDL 23.8 05/07/2011 1117   LDLCALC 29 05/07/2011 1117      Wt Readings from Last 3 Encounters:  12/31/20 187 lb 6.4 oz (85 kg)  03/28/20 190 lb (86.2 kg)  02/02/20 190 lb 9.6 oz (86.5 kg)      Other studies Reviewed: Additional studies/ records that were reviewed today include: INR levesl . Review of the above records demonstrates: he has been theraputic  For his INR    ASSESSMENT AND PLAN:  1.  Atrial fibrillation:    chronic.   HR is well  controlled.   Cont xarelto  ( is having some difficulty getting it   2.  Hyperlipidemia:  check labs today  Cont labs.   3.  Left lower leg wound :   had a puncture wound from a stick while hiking in the woods on May 6.    Was treated with doxycycline  Slow to heal Examined today .   No evidence of infection .   Still inflamed.  Minimal discomfort  He will follow up with Dr. Eloise Harman .   Current medicines are reviewed at length with the patient today.  The patient does not have concerns regarding medicines.  The following changes have been made:  See above   Labs/ tests ordered today include:   No orders of the defined  types were placed in this encounter.   Disposition:   FU with me or APP   in 1 year    Signed, Kristeen Miss, MD  12/31/2021 9:55 AM    [facility] [address] [place], [place], [place]  [address] Phone: [phone]; Fax: [phone]

## 2022-01-01 LAB — LIPID PANEL
Chol/HDL Ratio: 1.7 ratio (ref 0.0–5.0)
Cholesterol, Total: 104 mg/dL (ref 100–199)
HDL: 60 mg/dL (ref >39)
LDL Chol Calc (NIH): 30 mg/dL (ref 0–99)
Triglycerides: 64 mg/dL (ref 0–149)
VLDL Cholesterol Cal: 14 mg/dL (ref 5–40)

## 2022-01-01 LAB — ALT: ALT: 14 IU/L (ref 0–44)

## 2022-01-01 LAB — BASIC METABOLIC PANEL
BUN/Creatinine Ratio: 17 (ref 10–24)
BUN: 14 mg/dL (ref 8–27)
CO2: 25 mmol/L (ref 20–29)
Calcium: 9.4 mg/dL (ref 8.6–10.2)
Chloride: 101 mmol/L (ref 96–106)
Creatinine, Ser: 0.82 mg/dL (ref 0.76–1.27)
Glucose: 121 mg/dL — ABNORMAL HIGH (ref 70–99)
Potassium: 4.1 mmol/L (ref 3.5–5.2)
Sodium: 139 mmol/L (ref 134–144)
eGFR: 89 mL/min/{1.73_m2} (ref >59)

## 2022-01-03 ENCOUNTER — Encounter (INDEPENDENT_AMBULATORY_CARE_PROVIDER_SITE_OTHER): Payer: Medicare Other | Admitting: Ophthalmology

## 2022-01-03 DIAGNOSIS — H35033 Hypertensive retinopathy, bilateral: Secondary | ICD-10-CM

## 2022-01-03 DIAGNOSIS — H43813 Vitreous degeneration, bilateral: Secondary | ICD-10-CM

## 2022-01-03 DIAGNOSIS — H353231 Exudative age-related macular degeneration, bilateral, with active choroidal neovascularization: Secondary | ICD-10-CM

## 2022-01-03 DIAGNOSIS — I10 Essential (primary) hypertension: Secondary | ICD-10-CM

## 2022-01-03 DIAGNOSIS — H338 Other retinal detachments: Secondary | ICD-10-CM | POA: Diagnosis not present

## 2022-01-04 ENCOUNTER — Other Ambulatory Visit: Payer: Self-pay | Admitting: Cardiovascular Disease

## 2022-01-04 DIAGNOSIS — I482 Chronic atrial fibrillation, unspecified: Secondary | ICD-10-CM

## 2022-01-06 NOTE — Telephone Encounter (Signed)
Xarelto '20mg'$  refill request received. Pt is 81 years old, weight-81.3kg, Crea-0.82 on 12/31/2021, last seen by Dr. Acie Fredrickson on 12/31/2021, Diagnosis-afib, CrCl-2.1m/min. Dose is appropriate based on dosing criteria. Will send in refill to requested pharmacy.

## 2022-01-14 ENCOUNTER — Encounter (INDEPENDENT_AMBULATORY_CARE_PROVIDER_SITE_OTHER): Payer: Medicare Other | Admitting: Ophthalmology

## 2022-01-14 DIAGNOSIS — H353221 Exudative age-related macular degeneration, left eye, with active choroidal neovascularization: Secondary | ICD-10-CM

## 2022-01-15 DIAGNOSIS — E039 Hypothyroidism, unspecified: Secondary | ICD-10-CM | POA: Diagnosis not present

## 2022-01-15 DIAGNOSIS — Z125 Encounter for screening for malignant neoplasm of prostate: Secondary | ICD-10-CM | POA: Diagnosis not present

## 2022-01-15 DIAGNOSIS — E785 Hyperlipidemia, unspecified: Secondary | ICD-10-CM | POA: Diagnosis not present

## 2022-01-15 DIAGNOSIS — E119 Type 2 diabetes mellitus without complications: Secondary | ICD-10-CM | POA: Diagnosis not present

## 2022-01-15 DIAGNOSIS — R7989 Other specified abnormal findings of blood chemistry: Secondary | ICD-10-CM | POA: Diagnosis not present

## 2022-01-19 DIAGNOSIS — Z0000 Encounter for general adult medical examination without abnormal findings: Secondary | ICD-10-CM | POA: Diagnosis not present

## 2022-01-23 DIAGNOSIS — E1151 Type 2 diabetes mellitus with diabetic peripheral angiopathy without gangrene: Secondary | ICD-10-CM | POA: Diagnosis not present

## 2022-01-23 DIAGNOSIS — Z7901 Long term (current) use of anticoagulants: Secondary | ICD-10-CM | POA: Diagnosis not present

## 2022-01-23 DIAGNOSIS — I739 Peripheral vascular disease, unspecified: Secondary | ICD-10-CM | POA: Diagnosis not present

## 2022-01-23 DIAGNOSIS — Z1339 Encounter for screening examination for other mental health and behavioral disorders: Secondary | ICD-10-CM | POA: Diagnosis not present

## 2022-01-23 DIAGNOSIS — R82998 Other abnormal findings in urine: Secondary | ICD-10-CM | POA: Diagnosis not present

## 2022-01-23 DIAGNOSIS — Z0000 Encounter for general adult medical examination without abnormal findings: Secondary | ICD-10-CM | POA: Diagnosis not present

## 2022-01-23 DIAGNOSIS — N529 Male erectile dysfunction, unspecified: Secondary | ICD-10-CM | POA: Diagnosis not present

## 2022-01-23 DIAGNOSIS — E785 Hyperlipidemia, unspecified: Secondary | ICD-10-CM | POA: Diagnosis not present

## 2022-01-23 DIAGNOSIS — E039 Hypothyroidism, unspecified: Secondary | ICD-10-CM | POA: Diagnosis not present

## 2022-01-23 DIAGNOSIS — Z1331 Encounter for screening for depression: Secondary | ICD-10-CM | POA: Diagnosis not present

## 2022-01-23 DIAGNOSIS — I4821 Permanent atrial fibrillation: Secondary | ICD-10-CM | POA: Diagnosis not present

## 2022-02-13 ENCOUNTER — Encounter (INDEPENDENT_AMBULATORY_CARE_PROVIDER_SITE_OTHER): Payer: Medicare Other | Admitting: Ophthalmology

## 2022-02-13 DIAGNOSIS — H35033 Hypertensive retinopathy, bilateral: Secondary | ICD-10-CM

## 2022-02-13 DIAGNOSIS — I10 Essential (primary) hypertension: Secondary | ICD-10-CM | POA: Diagnosis not present

## 2022-02-13 DIAGNOSIS — H43813 Vitreous degeneration, bilateral: Secondary | ICD-10-CM | POA: Diagnosis not present

## 2022-02-13 DIAGNOSIS — H4423 Degenerative myopia, bilateral: Secondary | ICD-10-CM

## 2022-02-13 DIAGNOSIS — H33309 Unspecified retinal break, unspecified eye: Secondary | ICD-10-CM | POA: Diagnosis not present

## 2022-02-13 DIAGNOSIS — H353231 Exudative age-related macular degeneration, bilateral, with active choroidal neovascularization: Secondary | ICD-10-CM | POA: Diagnosis not present

## 2022-02-25 ENCOUNTER — Encounter (INDEPENDENT_AMBULATORY_CARE_PROVIDER_SITE_OTHER): Payer: Medicare Other | Admitting: Ophthalmology

## 2022-02-25 DIAGNOSIS — H353231 Exudative age-related macular degeneration, bilateral, with active choroidal neovascularization: Secondary | ICD-10-CM | POA: Diagnosis not present

## 2022-03-27 ENCOUNTER — Encounter (INDEPENDENT_AMBULATORY_CARE_PROVIDER_SITE_OTHER): Payer: Medicare Other | Admitting: Ophthalmology

## 2022-04-01 ENCOUNTER — Encounter (INDEPENDENT_AMBULATORY_CARE_PROVIDER_SITE_OTHER): Payer: Medicare Other | Admitting: Ophthalmology

## 2022-04-01 DIAGNOSIS — H35033 Hypertensive retinopathy, bilateral: Secondary | ICD-10-CM

## 2022-04-01 DIAGNOSIS — H353231 Exudative age-related macular degeneration, bilateral, with active choroidal neovascularization: Secondary | ICD-10-CM | POA: Diagnosis not present

## 2022-04-01 DIAGNOSIS — I10 Essential (primary) hypertension: Secondary | ICD-10-CM | POA: Diagnosis not present

## 2022-04-01 DIAGNOSIS — H43813 Vitreous degeneration, bilateral: Secondary | ICD-10-CM | POA: Diagnosis not present

## 2022-04-01 DIAGNOSIS — H4423 Degenerative myopia, bilateral: Secondary | ICD-10-CM | POA: Diagnosis not present

## 2022-04-01 DIAGNOSIS — H338 Other retinal detachments: Secondary | ICD-10-CM | POA: Diagnosis not present

## 2022-04-08 ENCOUNTER — Encounter (INDEPENDENT_AMBULATORY_CARE_PROVIDER_SITE_OTHER): Payer: Medicare Other | Admitting: Ophthalmology

## 2022-04-08 DIAGNOSIS — H353221 Exudative age-related macular degeneration, left eye, with active choroidal neovascularization: Secondary | ICD-10-CM | POA: Diagnosis not present

## 2022-04-26 DIAGNOSIS — Z23 Encounter for immunization: Secondary | ICD-10-CM | POA: Diagnosis not present

## 2022-05-13 ENCOUNTER — Encounter (INDEPENDENT_AMBULATORY_CARE_PROVIDER_SITE_OTHER): Payer: Medicare Other | Admitting: Ophthalmology

## 2022-05-13 DIAGNOSIS — H35033 Hypertensive retinopathy, bilateral: Secondary | ICD-10-CM

## 2022-05-13 DIAGNOSIS — H353231 Exudative age-related macular degeneration, bilateral, with active choroidal neovascularization: Secondary | ICD-10-CM

## 2022-05-13 DIAGNOSIS — I10 Essential (primary) hypertension: Secondary | ICD-10-CM | POA: Diagnosis not present

## 2022-05-13 DIAGNOSIS — H43813 Vitreous degeneration, bilateral: Secondary | ICD-10-CM | POA: Diagnosis not present

## 2022-05-13 DIAGNOSIS — H4423 Degenerative myopia, bilateral: Secondary | ICD-10-CM

## 2022-05-13 DIAGNOSIS — H338 Other retinal detachments: Secondary | ICD-10-CM

## 2022-05-14 DIAGNOSIS — H2512 Age-related nuclear cataract, left eye: Secondary | ICD-10-CM | POA: Diagnosis not present

## 2022-05-14 DIAGNOSIS — H25012 Cortical age-related cataract, left eye: Secondary | ICD-10-CM | POA: Diagnosis not present

## 2022-05-14 DIAGNOSIS — H53141 Visual discomfort, right eye: Secondary | ICD-10-CM | POA: Diagnosis not present

## 2022-05-14 DIAGNOSIS — H52213 Irregular astigmatism, bilateral: Secondary | ICD-10-CM | POA: Diagnosis not present

## 2022-05-14 DIAGNOSIS — H18591 Other hereditary corneal dystrophies, right eye: Secondary | ICD-10-CM | POA: Diagnosis not present

## 2022-05-14 DIAGNOSIS — H353231 Exudative age-related macular degeneration, bilateral, with active choroidal neovascularization: Secondary | ICD-10-CM | POA: Diagnosis not present

## 2022-05-17 ENCOUNTER — Other Ambulatory Visit: Payer: Self-pay | Admitting: Cardiovascular Disease

## 2022-05-20 ENCOUNTER — Encounter (INDEPENDENT_AMBULATORY_CARE_PROVIDER_SITE_OTHER): Payer: Medicare Other | Admitting: Ophthalmology

## 2022-05-20 DIAGNOSIS — H353221 Exudative age-related macular degeneration, left eye, with active choroidal neovascularization: Secondary | ICD-10-CM

## 2022-05-21 DIAGNOSIS — Z23 Encounter for immunization: Secondary | ICD-10-CM | POA: Diagnosis not present

## 2022-05-27 DIAGNOSIS — H25012 Cortical age-related cataract, left eye: Secondary | ICD-10-CM | POA: Diagnosis not present

## 2022-05-27 DIAGNOSIS — H25812 Combined forms of age-related cataract, left eye: Secondary | ICD-10-CM | POA: Diagnosis not present

## 2022-05-27 DIAGNOSIS — H2512 Age-related nuclear cataract, left eye: Secondary | ICD-10-CM | POA: Diagnosis not present

## 2022-06-24 ENCOUNTER — Encounter (INDEPENDENT_AMBULATORY_CARE_PROVIDER_SITE_OTHER): Payer: Medicare Other | Admitting: Ophthalmology

## 2022-06-24 DIAGNOSIS — H35033 Hypertensive retinopathy, bilateral: Secondary | ICD-10-CM | POA: Diagnosis not present

## 2022-06-24 DIAGNOSIS — H338 Other retinal detachments: Secondary | ICD-10-CM

## 2022-06-24 DIAGNOSIS — H4423 Degenerative myopia, bilateral: Secondary | ICD-10-CM | POA: Diagnosis not present

## 2022-06-24 DIAGNOSIS — I10 Essential (primary) hypertension: Secondary | ICD-10-CM | POA: Diagnosis not present

## 2022-06-24 DIAGNOSIS — H43813 Vitreous degeneration, bilateral: Secondary | ICD-10-CM

## 2022-06-24 DIAGNOSIS — H353231 Exudative age-related macular degeneration, bilateral, with active choroidal neovascularization: Secondary | ICD-10-CM

## 2022-06-27 ENCOUNTER — Encounter (INDEPENDENT_AMBULATORY_CARE_PROVIDER_SITE_OTHER): Payer: Medicare Other | Admitting: Ophthalmology

## 2022-06-27 DIAGNOSIS — H353221 Exudative age-related macular degeneration, left eye, with active choroidal neovascularization: Secondary | ICD-10-CM

## 2022-07-24 DIAGNOSIS — E039 Hypothyroidism, unspecified: Secondary | ICD-10-CM | POA: Diagnosis not present

## 2022-07-24 DIAGNOSIS — E785 Hyperlipidemia, unspecified: Secondary | ICD-10-CM | POA: Diagnosis not present

## 2022-07-24 DIAGNOSIS — I739 Peripheral vascular disease, unspecified: Secondary | ICD-10-CM | POA: Diagnosis not present

## 2022-07-24 DIAGNOSIS — E1151 Type 2 diabetes mellitus with diabetic peripheral angiopathy without gangrene: Secondary | ICD-10-CM | POA: Diagnosis not present

## 2022-07-24 DIAGNOSIS — Z23 Encounter for immunization: Secondary | ICD-10-CM | POA: Diagnosis not present

## 2022-07-24 DIAGNOSIS — H3530 Unspecified macular degeneration: Secondary | ICD-10-CM | POA: Diagnosis not present

## 2022-07-24 DIAGNOSIS — I4821 Permanent atrial fibrillation: Secondary | ICD-10-CM | POA: Diagnosis not present

## 2022-07-24 DIAGNOSIS — Z7901 Long term (current) use of anticoagulants: Secondary | ICD-10-CM | POA: Diagnosis not present

## 2022-08-05 ENCOUNTER — Encounter (INDEPENDENT_AMBULATORY_CARE_PROVIDER_SITE_OTHER): Payer: Medicare Other | Admitting: Ophthalmology

## 2022-08-05 DIAGNOSIS — H43813 Vitreous degeneration, bilateral: Secondary | ICD-10-CM | POA: Diagnosis not present

## 2022-08-05 DIAGNOSIS — H35033 Hypertensive retinopathy, bilateral: Secondary | ICD-10-CM

## 2022-08-05 DIAGNOSIS — H353231 Exudative age-related macular degeneration, bilateral, with active choroidal neovascularization: Secondary | ICD-10-CM | POA: Diagnosis not present

## 2022-08-05 DIAGNOSIS — H338 Other retinal detachments: Secondary | ICD-10-CM | POA: Diagnosis not present

## 2022-08-05 DIAGNOSIS — I10 Essential (primary) hypertension: Secondary | ICD-10-CM | POA: Diagnosis not present

## 2022-08-05 DIAGNOSIS — H4423 Degenerative myopia, bilateral: Secondary | ICD-10-CM

## 2022-08-08 ENCOUNTER — Encounter (INDEPENDENT_AMBULATORY_CARE_PROVIDER_SITE_OTHER): Payer: Medicare Other | Admitting: Ophthalmology

## 2022-08-08 DIAGNOSIS — H353221 Exudative age-related macular degeneration, left eye, with active choroidal neovascularization: Secondary | ICD-10-CM

## 2022-09-15 ENCOUNTER — Encounter (INDEPENDENT_AMBULATORY_CARE_PROVIDER_SITE_OTHER): Payer: Medicare Other | Admitting: Ophthalmology

## 2022-09-15 DIAGNOSIS — H353231 Exudative age-related macular degeneration, bilateral, with active choroidal neovascularization: Secondary | ICD-10-CM | POA: Diagnosis not present

## 2022-09-15 DIAGNOSIS — H35033 Hypertensive retinopathy, bilateral: Secondary | ICD-10-CM

## 2022-09-15 DIAGNOSIS — H43813 Vitreous degeneration, bilateral: Secondary | ICD-10-CM

## 2022-09-15 DIAGNOSIS — I10 Essential (primary) hypertension: Secondary | ICD-10-CM | POA: Diagnosis not present

## 2022-09-15 DIAGNOSIS — H338 Other retinal detachments: Secondary | ICD-10-CM | POA: Diagnosis not present

## 2022-09-22 ENCOUNTER — Encounter (INDEPENDENT_AMBULATORY_CARE_PROVIDER_SITE_OTHER): Payer: Medicare Other | Admitting: Ophthalmology

## 2022-09-22 DIAGNOSIS — H353221 Exudative age-related macular degeneration, left eye, with active choroidal neovascularization: Secondary | ICD-10-CM | POA: Diagnosis not present

## 2022-10-27 ENCOUNTER — Encounter (INDEPENDENT_AMBULATORY_CARE_PROVIDER_SITE_OTHER): Payer: Medicare Other | Admitting: Ophthalmology

## 2022-10-27 DIAGNOSIS — I10 Essential (primary) hypertension: Secondary | ICD-10-CM | POA: Diagnosis not present

## 2022-10-27 DIAGNOSIS — H353231 Exudative age-related macular degeneration, bilateral, with active choroidal neovascularization: Secondary | ICD-10-CM

## 2022-10-27 DIAGNOSIS — H43813 Vitreous degeneration, bilateral: Secondary | ICD-10-CM | POA: Diagnosis not present

## 2022-10-27 DIAGNOSIS — H35033 Hypertensive retinopathy, bilateral: Secondary | ICD-10-CM | POA: Diagnosis not present

## 2022-11-03 ENCOUNTER — Encounter (INDEPENDENT_AMBULATORY_CARE_PROVIDER_SITE_OTHER): Payer: Medicare Other | Admitting: Ophthalmology

## 2022-11-03 DIAGNOSIS — H353221 Exudative age-related macular degeneration, left eye, with active choroidal neovascularization: Secondary | ICD-10-CM

## 2022-12-02 ENCOUNTER — Other Ambulatory Visit: Payer: Self-pay | Admitting: Cardiovascular Disease

## 2022-12-02 DIAGNOSIS — I482 Chronic atrial fibrillation, unspecified: Secondary | ICD-10-CM

## 2022-12-02 NOTE — Telephone Encounter (Signed)
Pt last saw Dr Elease Hashimoto 12/31/21, last labs 12/31/21 Creat 0.82, age 82, weight 81.3kg, CrCl 82.62, based on CrCl pt is on appropriate dosage of Xarelto 20mg  QD for afib.  Will refill rx.

## 2022-12-08 ENCOUNTER — Encounter (INDEPENDENT_AMBULATORY_CARE_PROVIDER_SITE_OTHER): Payer: Medicare Other | Admitting: Ophthalmology

## 2022-12-08 DIAGNOSIS — H43813 Vitreous degeneration, bilateral: Secondary | ICD-10-CM | POA: Diagnosis not present

## 2022-12-08 DIAGNOSIS — H35033 Hypertensive retinopathy, bilateral: Secondary | ICD-10-CM | POA: Diagnosis not present

## 2022-12-08 DIAGNOSIS — H4423 Degenerative myopia, bilateral: Secondary | ICD-10-CM | POA: Diagnosis not present

## 2022-12-08 DIAGNOSIS — H338 Other retinal detachments: Secondary | ICD-10-CM | POA: Diagnosis not present

## 2022-12-08 DIAGNOSIS — H353231 Exudative age-related macular degeneration, bilateral, with active choroidal neovascularization: Secondary | ICD-10-CM | POA: Diagnosis not present

## 2022-12-08 DIAGNOSIS — I10 Essential (primary) hypertension: Secondary | ICD-10-CM | POA: Diagnosis not present

## 2022-12-12 ENCOUNTER — Encounter (INDEPENDENT_AMBULATORY_CARE_PROVIDER_SITE_OTHER): Payer: Medicare Other | Admitting: Ophthalmology

## 2022-12-12 DIAGNOSIS — H353221 Exudative age-related macular degeneration, left eye, with active choroidal neovascularization: Secondary | ICD-10-CM

## 2023-01-12 NOTE — Progress Notes (Unsigned)
  Cardiology Office Note:  .   Date:  01/12/2023  ID:  Laurelyn Sickle, DOB Mar 25, 1941, MRN [id number] PCP: Garlan Fillers, MD  [facility] Providers Cardiologist:  Kristeen Miss, MD { Click to update primary MD,subspecialty MD or APP then REFRESH:1}   Patient Profile: .      PMH: Atrial fibrillation On Xarelto for stroke prevention for CHA2DS2-VASc score of OSA Hyperlipidemia Chronic HFrEF/NICM Mild pulmonary hypertension Mild enlargement of RV Mildly elevated pulmonary artery pressures  Tricuspid valve regurgitation Mild to moderate TR on echo 12/24/2021 Mitral valve regurgitation Mild MR on echo 12/24/2021   Admission in 2007 with new onset congestive heart failure and underwent cardiac catheterization which revealed relatively smooth and normal coronary arteries.  His cardiac enzymes were negative for myocardial infarction. EF was 30%. Had AF RVR, felt to have NICM and not felt to be candidate for ICD placement at that time. LVEF normalized on subsequent echo. He has severe dilatation of both atrium and tricuspid regurgitation. He has maintained consistent follow-up.   Last cardiology clinic visit was 12/31/2021 with Dr. Elease Hashimoto at which time he had no specific cardiac symptoms.  He had a puncture wound on his left lower leg that was slow to heal. Was advised to return in 1 year for follow-up.         History of Present Illness: .   Craig Simon is a *** 82 y.o. male ***  ROS: ***       Studies Reviewed: .        *** Risk Assessment/Calculations:    CHA2DS2-VASc Score = 5  {Click here to calculate score.  REFRESH note before signing. :1} This indicates a 7.2% annual risk of stroke. The patient's score is based upon: CHF History: 1 HTN History: 1 Diabetes History: 1 Stroke History: 0 Vascular Disease History: 0 Age Score: 2 Gender Score: 0   {This patient has a significant risk of stroke if diagnosed with atrial fibrillation.  Please consider VKA or  DOAC agent for anticoagulation if the bleeding risk is acceptable.   You can also use the SmartPhrase .[email or web address] for documentation.   :[id number]} No BP recorded.  {Refresh Note OR Click here to enter BP  :1}***       Physical Exam:   VS:  There were no vitals taken for this visit.   Wt Readings from Last 3 Encounters:  12/31/21 179 lb 3.2 oz (81.3 kg)  12/31/20 187 lb 6.4 oz (85 kg)  03/28/20 190 lb (86.2 kg)    GEN: Well nourished, well developed in no acute distress NECK: No JVD; No carotid bruits CARDIAC: ***RRR, no murmurs, rubs, gallops RESPIRATORY:  Clear to auscultation without rales, wheezing or rhonchi  ABDOMEN: Soft, non-tender, non-distended EXTREMITIES:  No edema; No deformity     ASSESSMENT AND PLAN: .    Longstanding atrial fibrillation: Pulmonary hypertension: Valve disease: Hypertension: Hyperlipidemia:     {Are you ordering a CV Procedure (e.g. stress test, cath, DCCV, TEE, etc)?   Press F2        :[id number]}  Dispo: ***  Signed, Eligha Bridegroom, NP-C

## 2023-01-14 ENCOUNTER — Ambulatory Visit: Payer: Medicare Other | Attending: Nurse Practitioner | Admitting: Nurse Practitioner

## 2023-01-14 ENCOUNTER — Encounter: Payer: Self-pay | Admitting: Nurse Practitioner

## 2023-01-14 VITALS — BP 130/80 | HR 81 | Ht 74.0 in | Wt 177.4 lb

## 2023-01-14 DIAGNOSIS — I4891 Unspecified atrial fibrillation: Secondary | ICD-10-CM | POA: Diagnosis not present

## 2023-01-14 DIAGNOSIS — I358 Other nonrheumatic aortic valve disorders: Secondary | ICD-10-CM

## 2023-01-14 DIAGNOSIS — E785 Hyperlipidemia, unspecified: Secondary | ICD-10-CM | POA: Diagnosis not present

## 2023-01-14 DIAGNOSIS — I361 Nonrheumatic tricuspid (valve) insufficiency: Secondary | ICD-10-CM | POA: Diagnosis not present

## 2023-01-14 DIAGNOSIS — Z7901 Long term (current) use of anticoagulants: Secondary | ICD-10-CM | POA: Diagnosis not present

## 2023-01-14 DIAGNOSIS — I4811 Longstanding persistent atrial fibrillation: Secondary | ICD-10-CM | POA: Diagnosis not present

## 2023-01-14 DIAGNOSIS — I10 Essential (primary) hypertension: Secondary | ICD-10-CM

## 2023-01-14 DIAGNOSIS — I272 Pulmonary hypertension, unspecified: Secondary | ICD-10-CM

## 2023-01-14 DIAGNOSIS — E782 Mixed hyperlipidemia: Secondary | ICD-10-CM

## 2023-01-14 DIAGNOSIS — I340 Nonrheumatic mitral (valve) insufficiency: Secondary | ICD-10-CM

## 2023-01-14 NOTE — Patient Instructions (Signed)
Medication Instructions:  Your physician recommends that you continue on your current medications as directed. Please refer to the Current Medication list given to you today.  *If you need a refill on your cardiac medications before your next appointment, please call your pharmacy*  Lab Work: LIPID, CBC, CMET-TODAY If you have labs (blood work) drawn today and your tests are completely normal, you will receive your results only by: MyChart Message (if you have MyChart) OR A paper copy in the mail If you have any lab test that is abnormal or we need to change your treatment, we will call you to review the results.  Follow-Up: At [facility], you and your health needs are our priority.  As part of our continuing mission to provide you with exceptional heart care, we have created designated Provider Care Teams.  These Care Teams include your primary Cardiologist (physician) and Advanced Practice Providers (APPs -  Physician Assistants and Nurse Practitioners) who all work together to provide you with the care you need, when you need it.  We recommend signing up for the patient portal called "MyChart".  Sign up information is provided on this After Visit Summary.  MyChart is used to connect with patients for Virtual Visits (Telemedicine).  Patients are able to view lab/test results, encounter notes, upcoming appointments, etc.  Non-urgent messages can be sent to your provider as well.   To learn more about what you can do with MyChart, go to [email or web address].    Your next appointment:   6 month(s)  Provider:   Kristeen Miss, MD

## 2023-01-15 LAB — COMPREHENSIVE METABOLIC PANEL
ALT: 11 IU/L (ref 0–44)
AST: 13 IU/L (ref 0–40)
Albumin: 4.3 g/dL (ref 3.7–4.7)
Alkaline Phosphatase: 60 IU/L (ref 44–121)
BUN/Creatinine Ratio: 17 (ref 10–24)
BUN: 15 mg/dL (ref 8–27)
Bilirubin Total: 0.5 mg/dL (ref 0.0–1.2)
CO2: 25 mmol/L (ref 20–29)
Calcium: 9 mg/dL (ref 8.6–10.2)
Chloride: 102 mmol/L (ref 96–106)
Creatinine, Ser: 0.86 mg/dL (ref 0.76–1.27)
Globulin, Total: 1.8 g/dL (ref 1.5–4.5)
Glucose: 136 mg/dL — ABNORMAL HIGH (ref 70–99)
Potassium: 4.4 mmol/L (ref 3.5–5.2)
Sodium: 140 mmol/L (ref 134–144)
Total Protein: 6.1 g/dL (ref 6.0–8.5)
eGFR: 87 mL/min/{1.73_m2} (ref >59)

## 2023-01-15 LAB — LIPID PANEL
Chol/HDL Ratio: 1.9 ratio (ref 0.0–5.0)
Cholesterol, Total: 111 mg/dL (ref 100–199)
HDL: 58 mg/dL (ref >39)
LDL Chol Calc (NIH): 36 mg/dL (ref 0–99)
Triglycerides: 87 mg/dL (ref 0–149)
VLDL Cholesterol Cal: 17 mg/dL (ref 5–40)

## 2023-01-15 LAB — CBC
Hematocrit: 37.4 % — ABNORMAL LOW (ref 37.5–51.0)
Hemoglobin: 12.1 g/dL — ABNORMAL LOW (ref 13.0–17.7)
MCH: 29.5 pg (ref 26.6–33.0)
MCHC: 32.4 g/dL (ref 31.5–35.7)
MCV: 91 fL (ref 79–97)
Platelets: 126 10*3/uL — ABNORMAL LOW (ref 150–450)
RBC: 4.1 x10E6/uL — ABNORMAL LOW (ref 4.14–5.80)
RDW: 14.5 % (ref 11.6–15.4)
WBC: 4.7 10*3/uL (ref 3.4–10.8)

## 2023-01-19 ENCOUNTER — Encounter (INDEPENDENT_AMBULATORY_CARE_PROVIDER_SITE_OTHER): Payer: Medicare Other | Admitting: Ophthalmology

## 2023-01-19 DIAGNOSIS — I10 Essential (primary) hypertension: Secondary | ICD-10-CM

## 2023-01-19 DIAGNOSIS — H4423 Degenerative myopia, bilateral: Secondary | ICD-10-CM

## 2023-01-19 DIAGNOSIS — H43813 Vitreous degeneration, bilateral: Secondary | ICD-10-CM | POA: Diagnosis not present

## 2023-01-19 DIAGNOSIS — H35033 Hypertensive retinopathy, bilateral: Secondary | ICD-10-CM | POA: Diagnosis not present

## 2023-01-19 DIAGNOSIS — H353231 Exudative age-related macular degeneration, bilateral, with active choroidal neovascularization: Secondary | ICD-10-CM | POA: Diagnosis not present

## 2023-01-23 ENCOUNTER — Encounter (INDEPENDENT_AMBULATORY_CARE_PROVIDER_SITE_OTHER): Payer: Medicare Other | Admitting: Ophthalmology

## 2023-01-23 DIAGNOSIS — H353221 Exudative age-related macular degeneration, left eye, with active choroidal neovascularization: Secondary | ICD-10-CM

## 2023-02-09 DIAGNOSIS — Z23 Encounter for immunization: Secondary | ICD-10-CM | POA: Diagnosis not present

## 2023-03-02 ENCOUNTER — Encounter (INDEPENDENT_AMBULATORY_CARE_PROVIDER_SITE_OTHER): Payer: Medicare Other | Admitting: Ophthalmology

## 2023-03-02 DIAGNOSIS — H338 Other retinal detachments: Secondary | ICD-10-CM

## 2023-03-02 DIAGNOSIS — H43813 Vitreous degeneration, bilateral: Secondary | ICD-10-CM | POA: Diagnosis not present

## 2023-03-02 DIAGNOSIS — H4423 Degenerative myopia, bilateral: Secondary | ICD-10-CM

## 2023-03-02 DIAGNOSIS — H35033 Hypertensive retinopathy, bilateral: Secondary | ICD-10-CM | POA: Diagnosis not present

## 2023-03-02 DIAGNOSIS — H353231 Exudative age-related macular degeneration, bilateral, with active choroidal neovascularization: Secondary | ICD-10-CM | POA: Diagnosis not present

## 2023-03-02 DIAGNOSIS — I10 Essential (primary) hypertension: Secondary | ICD-10-CM | POA: Diagnosis not present

## 2023-03-06 ENCOUNTER — Other Ambulatory Visit: Payer: Self-pay | Admitting: Cardiovascular Disease

## 2023-03-10 ENCOUNTER — Encounter (INDEPENDENT_AMBULATORY_CARE_PROVIDER_SITE_OTHER): Payer: Medicare Other | Admitting: Ophthalmology

## 2023-03-10 ENCOUNTER — Other Ambulatory Visit: Payer: Self-pay | Admitting: Cardiovascular Disease

## 2023-03-10 DIAGNOSIS — H353221 Exudative age-related macular degeneration, left eye, with active choroidal neovascularization: Secondary | ICD-10-CM | POA: Diagnosis not present

## 2023-03-10 NOTE — Telephone Encounter (Signed)
Pt's medication was sent to pt's pharmacy as requested. Confirmation received.  °

## 2023-04-09 DIAGNOSIS — E039 Hypothyroidism, unspecified: Secondary | ICD-10-CM | POA: Diagnosis not present

## 2023-04-09 DIAGNOSIS — Z0189 Encounter for other specified special examinations: Secondary | ICD-10-CM | POA: Diagnosis not present

## 2023-04-09 DIAGNOSIS — E1151 Type 2 diabetes mellitus with diabetic peripheral angiopathy without gangrene: Secondary | ICD-10-CM | POA: Diagnosis not present

## 2023-04-09 DIAGNOSIS — Z125 Encounter for screening for malignant neoplasm of prostate: Secondary | ICD-10-CM | POA: Diagnosis not present

## 2023-04-09 DIAGNOSIS — Z1389 Encounter for screening for other disorder: Secondary | ICD-10-CM | POA: Diagnosis not present

## 2023-04-09 DIAGNOSIS — E785 Hyperlipidemia, unspecified: Secondary | ICD-10-CM | POA: Diagnosis not present

## 2023-04-09 DIAGNOSIS — Z0000 Encounter for general adult medical examination without abnormal findings: Secondary | ICD-10-CM | POA: Diagnosis not present

## 2023-04-13 ENCOUNTER — Encounter (INDEPENDENT_AMBULATORY_CARE_PROVIDER_SITE_OTHER): Payer: Medicare Other | Admitting: Ophthalmology

## 2023-04-13 DIAGNOSIS — H43813 Vitreous degeneration, bilateral: Secondary | ICD-10-CM | POA: Diagnosis not present

## 2023-04-13 DIAGNOSIS — H35033 Hypertensive retinopathy, bilateral: Secondary | ICD-10-CM | POA: Diagnosis not present

## 2023-04-13 DIAGNOSIS — H338 Other retinal detachments: Secondary | ICD-10-CM | POA: Diagnosis not present

## 2023-04-13 DIAGNOSIS — I10 Essential (primary) hypertension: Secondary | ICD-10-CM

## 2023-04-13 DIAGNOSIS — H353231 Exudative age-related macular degeneration, bilateral, with active choroidal neovascularization: Secondary | ICD-10-CM

## 2023-04-16 ENCOUNTER — Encounter (INDEPENDENT_AMBULATORY_CARE_PROVIDER_SITE_OTHER): Payer: Medicare Other | Admitting: Ophthalmology

## 2023-04-16 DIAGNOSIS — H353221 Exudative age-related macular degeneration, left eye, with active choroidal neovascularization: Secondary | ICD-10-CM | POA: Diagnosis not present

## 2023-04-16 DIAGNOSIS — R200 Anesthesia of skin: Secondary | ICD-10-CM | POA: Diagnosis not present

## 2023-04-16 DIAGNOSIS — I739 Peripheral vascular disease, unspecified: Secondary | ICD-10-CM | POA: Diagnosis not present

## 2023-04-16 DIAGNOSIS — I4821 Permanent atrial fibrillation: Secondary | ICD-10-CM | POA: Diagnosis not present

## 2023-04-16 DIAGNOSIS — Z23 Encounter for immunization: Secondary | ICD-10-CM | POA: Diagnosis not present

## 2023-04-16 DIAGNOSIS — E039 Hypothyroidism, unspecified: Secondary | ICD-10-CM | POA: Diagnosis not present

## 2023-04-16 DIAGNOSIS — E1151 Type 2 diabetes mellitus with diabetic peripheral angiopathy without gangrene: Secondary | ICD-10-CM | POA: Diagnosis not present

## 2023-04-16 DIAGNOSIS — Z0000 Encounter for general adult medical examination without abnormal findings: Secondary | ICD-10-CM | POA: Diagnosis not present

## 2023-04-16 DIAGNOSIS — E785 Hyperlipidemia, unspecified: Secondary | ICD-10-CM | POA: Diagnosis not present

## 2023-04-16 DIAGNOSIS — Z1331 Encounter for screening for depression: Secondary | ICD-10-CM | POA: Diagnosis not present

## 2023-04-16 DIAGNOSIS — Z7901 Long term (current) use of anticoagulants: Secondary | ICD-10-CM | POA: Diagnosis not present

## 2023-04-16 DIAGNOSIS — R82998 Other abnormal findings in urine: Secondary | ICD-10-CM | POA: Diagnosis not present

## 2023-04-16 DIAGNOSIS — H3530 Unspecified macular degeneration: Secondary | ICD-10-CM | POA: Diagnosis not present

## 2023-04-16 DIAGNOSIS — Z1339 Encounter for screening examination for other mental health and behavioral disorders: Secondary | ICD-10-CM | POA: Diagnosis not present

## 2023-05-25 ENCOUNTER — Encounter (INDEPENDENT_AMBULATORY_CARE_PROVIDER_SITE_OTHER): Payer: Medicare Other | Admitting: Ophthalmology

## 2023-05-25 DIAGNOSIS — H4423 Degenerative myopia, bilateral: Secondary | ICD-10-CM

## 2023-05-25 DIAGNOSIS — H35033 Hypertensive retinopathy, bilateral: Secondary | ICD-10-CM | POA: Diagnosis not present

## 2023-05-25 DIAGNOSIS — I10 Essential (primary) hypertension: Secondary | ICD-10-CM | POA: Diagnosis not present

## 2023-05-25 DIAGNOSIS — H43813 Vitreous degeneration, bilateral: Secondary | ICD-10-CM

## 2023-05-25 DIAGNOSIS — H353231 Exudative age-related macular degeneration, bilateral, with active choroidal neovascularization: Secondary | ICD-10-CM | POA: Diagnosis not present

## 2023-05-25 DIAGNOSIS — H338 Other retinal detachments: Secondary | ICD-10-CM

## 2023-05-26 ENCOUNTER — Other Ambulatory Visit: Payer: Self-pay | Admitting: Cardiovascular Disease

## 2023-05-26 DIAGNOSIS — I482 Chronic atrial fibrillation, unspecified: Secondary | ICD-10-CM

## 2023-05-26 NOTE — Telephone Encounter (Signed)
Prescription refill request for Xarelto received.  Indication: AF Last office visit: 01/14/23  Benjamine Sprague NP Weight: 80.5kg Age: 82 Scr: 0.86 on 01/14/23  Epic CrCl: 76.70  Based on above findings Xarelto 20mg  daily is the appropriate dose.  Refill approved.

## 2023-05-28 ENCOUNTER — Encounter (INDEPENDENT_AMBULATORY_CARE_PROVIDER_SITE_OTHER): Payer: Medicare Other | Admitting: Ophthalmology

## 2023-05-28 DIAGNOSIS — H353221 Exudative age-related macular degeneration, left eye, with active choroidal neovascularization: Secondary | ICD-10-CM

## 2023-06-29 ENCOUNTER — Encounter (INDEPENDENT_AMBULATORY_CARE_PROVIDER_SITE_OTHER): Payer: Medicare Other | Admitting: Ophthalmology

## 2023-06-29 DIAGNOSIS — I10 Essential (primary) hypertension: Secondary | ICD-10-CM

## 2023-06-29 DIAGNOSIS — H35033 Hypertensive retinopathy, bilateral: Secondary | ICD-10-CM | POA: Diagnosis not present

## 2023-06-29 DIAGNOSIS — H353231 Exudative age-related macular degeneration, bilateral, with active choroidal neovascularization: Secondary | ICD-10-CM

## 2023-06-29 DIAGNOSIS — H43813 Vitreous degeneration, bilateral: Secondary | ICD-10-CM | POA: Diagnosis not present

## 2023-06-29 DIAGNOSIS — H338 Other retinal detachments: Secondary | ICD-10-CM | POA: Diagnosis not present

## 2023-07-10 DIAGNOSIS — Z23 Encounter for immunization: Secondary | ICD-10-CM | POA: Diagnosis not present

## 2023-07-13 ENCOUNTER — Encounter (INDEPENDENT_AMBULATORY_CARE_PROVIDER_SITE_OTHER): Payer: Medicare Other | Admitting: Ophthalmology

## 2023-07-13 DIAGNOSIS — H353221 Exudative age-related macular degeneration, left eye, with active choroidal neovascularization: Secondary | ICD-10-CM

## 2023-07-18 ENCOUNTER — Encounter: Payer: Self-pay | Admitting: Cardiovascular Disease

## 2023-07-18 NOTE — Progress Notes (Signed)
 Cardiology Office Note   Date:  07/20/2023   ID:  Craig Simon, DOB 01/22/1941, MRN [id number]  PCP:  Yolande Toribio MATSU, MD  Cardiologist:   Aleene Passe, MD   Chief Complaint  Patient presents with   Atrial Fibrillation          Congestive Heart Failure        1. Atrial Fibrillation 2. Hypothyroidism 3. Diabetes mellitus 4. Hyperlipidemia 5. Obstructive sleep apnea- wears CPAP at night   Craig Simon is a 83 year old gentleman with a history of atrial fibrillation. He's done quite well from a cardiac standpoint. He's not had any episodes of chest pain or shortness of breath. He's been able to do all of his normal activities without any problems.  He has noticed some leg edema.  He has not been eating much salt.  October 07, 2013:  Craig Simon is doing well.    He is able to do all of his normal activities without any significant problems. He denies any chest pain or shortness breath.   October 16, 2014:  Craig Simon is a 83 y.o. male who presents for f/u of his atrial fib   No CP , no dyspnea. Still very active.Craig Simon   October 18, 2015:  Staying very active.    BP and HR are great.   No CP or dyspena Going canoeing this Saturday.   October 22, 2016:    Craig Simon  seen back today for further evaluation of his chronic atrial fibrillation   Under lots of stress .   Nov 20, 2017:  Craig Simon is seen back today for follow up office visit  For his atrial fib  No CP or dyspnea.     December 07, 2019: Craig Simon is seen back today for follow-up of his atrial fibrillation.  His last echocardiogram was in 2016 which reveals normal left ventricular systolic function.  He has severe dilatation of the both atrium.  He has moderate tricuspid regurgitation.  PA pressure estimated at 37 mmHg.  His insurance company wants him to switch from Eliquis  to Xarelto .  Apparently it it is the formulary medication for his insurance. No hematuria , no hx of GI bleeding   No cp or dyspnea.   December 31, 2020: Craig Simon is seen  today for follow up of his Afib. He has moderate TR Normal LV function Mild PA HTN  HR is slow  Labs from Dr. Arnita office showed total cholesterol of 109 Triglyceride level is 90 HDL is 50 LDL is 41  December 31, 2021 Craig Simon is seen today for follow up visit for his atrial fibrillation and congestive heart failure.  Recent echocardiogram from December 24, 2021 reveals normal left ventricular systolic function.  His EF is 50 to 55%.  We were not able to evaluate his diastolic function.  His RV is mildly enlarged and his pulmonary artery pressures were mildly elevated.  He has severe biatrial enlargement.  He has mild mitral regurgitation.  he has moderate tricuspid regurgitation.  Pulmonary artery pressures have been mildly elevated.  Was recently walking in the woods.   Had wound from a broken stick  Dr. Yolande sent in a prescription for doxycycline He has not had a doctor look at it yet.   Left lower leg    Slightly firm, slightly warm,  no drainage, no residual dranage No bone tenderness   Jan. 13, 2025  Craig Simon is seen for follow up of his atrial fib Is still very active .   Lipid  levels from October 4 reveal a total cholesterol of 110 His HDL is 60 LDL is 35 Triglyceride level is 81    Past Medical History:  Diagnosis Date   Allergic rhinitis    Chest pain    CHF (congestive heart failure) (HCC)    Chronic anticoagulation    Chronic atrial fibrillation (HCC)    Diabetes mellitus    Hyperlipidemia    Hypothyroidism    Internal hemorrhoids without mention of complication 04/06/2001   Leg edema    Macular degeneration    Mitral regurgitation    Sleep apnea    has cpap but cannot tolerate    Past Surgical History:  Procedure Laterality Date   ADENOIDECTOMY  1949   CARDIAC CATHETERIZATION  11/03/2000   EF 40%   CARDIOVASCULAR STRESS TEST  09/20/2004   EF 56%. NO EVIDENCE OF ISCHEMIA   cornea surgery  2009   right eye   DOPPLER ECHOCARDIOGRAPHY  03/09/2001   EF  50%. MODERATE LVH   EYE SURGERY  2007   right eye; detached retina   HAMMER TOE SURGERY  07/20/12   right   TURBINATE REDUCTION  1989   US  ECHOCARDIOGRAPHY  09/10/2004   EF 55-60%     Current Outpatient Medications  Medication Sig Dispense Refill   Bevacizumab  (AVASTIN ) 100 MG/4ML SOLN Inject into the vein as directed.      ciprofloxacin (CILOXAN) 0.3 % ophthalmic solution INSTILL ONE DROP INTO BOTH EYES 4 TIMES A DAY FOR TWO DAYS AFTER EACH MONTHLY EYE INJECTION     ezetimibe (ZETIA) 10 MG tablet Take 10 mg by mouth daily.       fish oil-omega-3 fatty acids 1000 MG capsule Take 2 g by mouth 2 (two) times daily.       fluticasone  (FLONASE ) 50 MCG/ACT nasal spray Place 2 sprays into the nose as needed for rhinitis. 1 g 12   furosemide  (LASIX ) 40 MG tablet TAKE 1 TABLET BY MOUTH THREE TIMES A WEEK. 45 tablet 3   levothyroxine (SYNTHROID, LEVOTHROID) 150 MCG tablet Take 150 mcg by mouth daily.       lisinopril  (ZESTRIL ) 5 MG tablet TAKE 1 TABLET BY MOUTH EVERY DAY 90 tablet 3   metFORMIN (GLUCOPHAGE-XR) 500 MG 24 hr tablet Take 1 tablet by mouth every evening.     Multiple Vitamin (MULTIVITAMIN) tablet Take 1 tablet by mouth daily.       multivitamin-lutein (OCUVITE-LUTEIN) CAPS Take 1 capsule by mouth daily.       pioglitazone (ACTOS) 15 MG tablet Take 15 mg by mouth daily.       potassium chloride  SA (KLOR-CON  M20) 20 MEQ tablet TAKE 1 TABLET BY MOUTH TWICE A DAY ON MONDAY, WEDNESDAY, AND FRIDAY 75 tablet 3   rosuvastatin (CRESTOR) 20 MG tablet Take 20 mg by mouth daily.       SF 1.1 % GEL dental gel Place 1 application  onto teeth at bedtime.     tadalafil (CIALIS) 20 MG tablet Take 20 mg by mouth daily as needed for erectile dysfunction.     XARELTO  20 MG TABS tablet TAKE 1 TABLET BY MOUTH DAILY WITH SUPPER 90 tablet 1   No current facility-administered medications for this visit.    Allergies:   Amiodarone    Social History:  The patient  reports that he has quit smoking. His  smoking use included cigars and pipe. He has never used smokeless tobacco. He reports current alcohol  use. He reports that he does  not use drugs.   Family History:  The patient's family history includes Heart disease in his brother, father, and mother.    ROS:   Noted in current history, otherwise review of systems is negative.   Physical Exam: Blood pressure 138/65, pulse 76, height 6' 2 (1.88 m), weight 183 lb 12.8 oz (83.4 kg), SpO2 98%.      GEN:  Well nourished, well developed in no acute distress HEENT: Normal NECK: No JVD; No carotid bruits LYMPHATICS: No lymphadenopathy CARDIAC: irreg. Irreg.  RESPIRATORY:  Clear to auscultation without rales, wheezing or rhonchi  ABDOMEN: Soft, non-tender, non-distended MUSCULOSKELETAL:  No edema; No deformity  SKIN: Warm and dry NEUROLOGIC:  Alert and oriented x 3    EKG:          Recent Labs: 01/14/2023: ALT 11; BUN 15; Creatinine, Ser 0.86; Hemoglobin 12.1; Platelets 126; Potassium 4.4; Sodium 140    Lipid Panel    Component Value Date/Time   CHOL 111 01/14/2023 0959   TRIG 87 01/14/2023 0959   HDL 58 01/14/2023 0959   CHOLHDL 1.9 01/14/2023 0959   CHOLHDL 2 05/07/2011 1117   VLDL 23.8 05/07/2011 1117   LDLCALC 36 01/14/2023 0959      Wt Readings from Last 3 Encounters:  07/20/23 183 lb 12.8 oz (83.4 kg)  01/14/23 177 lb 6.4 oz (80.5 kg)  12/31/21 179 lb 3.2 oz (81.3 kg)      Other studies Reviewed: Additional studies/ records that were reviewed today include: INR levesl . Review of the above records demonstrates: he has been theraputic  For his INR    ASSESSMENT AND PLAN:  1.  Atrial fibrillation:    chronic Afib .  Well tolerated. .  Cont xarelto   RH is well controlled.    2.  Hyperlipidemia:   lipids from several months ago look great     Current medicines are reviewed at length with the patient today.  The patient does not have concerns regarding medicines.  The following changes have been made:   See above   Labs/ tests ordered today include:   No orders of the defined types were placed in this encounter.   Disposition:   follow up with Dr. Michele in 1 year     Signed, Aleene Passe, MD  07/20/2023 5:01 PM    [facility] [address] [place], [place], [place]  [address] Phone: [phone]; Fax: [phone]

## 2023-07-20 ENCOUNTER — Ambulatory Visit: Payer: Medicare Other | Attending: Cardiovascular Disease | Admitting: Cardiovascular Disease

## 2023-07-20 ENCOUNTER — Encounter: Payer: Self-pay | Admitting: Cardiovascular Disease

## 2023-07-20 VITALS — BP 138/65 | HR 76 | Ht 74.0 in | Wt 183.8 lb

## 2023-07-20 DIAGNOSIS — E782 Mixed hyperlipidemia: Secondary | ICD-10-CM | POA: Insufficient documentation

## 2023-07-20 DIAGNOSIS — I482 Chronic atrial fibrillation, unspecified: Secondary | ICD-10-CM | POA: Insufficient documentation

## 2023-07-20 NOTE — Patient Instructions (Signed)
 Medication Instructions:  Your physician recommends that you continue on your current medications as directed. Please refer to the Current Medication list given to you today.  *If you need a refill on your cardiac medications before your next appointment, please call your pharmacy*  Lab Work: None ordered today.  Testing/Procedures: None ordered today.  Follow-Up: At [facility], you and your health needs are our priority.  As part of our continuing mission to provide you with exceptional heart care, we have created designated Provider Care Teams.  These Care Teams include your primary Cardiologist (physician) and Advanced Practice Providers (APPs -  Physician Assistants and Nurse Practitioners) who all work together to provide you with the care you need, when you need it.  Your next appointment:   1 year(s)  The format for your next appointment:   In Person  Provider:   Madonna Large, MD

## 2023-08-10 ENCOUNTER — Encounter (INDEPENDENT_AMBULATORY_CARE_PROVIDER_SITE_OTHER): Payer: Medicare Other | Admitting: Ophthalmology

## 2023-08-10 DIAGNOSIS — H338 Other retinal detachments: Secondary | ICD-10-CM

## 2023-08-10 DIAGNOSIS — H35033 Hypertensive retinopathy, bilateral: Secondary | ICD-10-CM | POA: Diagnosis not present

## 2023-08-10 DIAGNOSIS — H43813 Vitreous degeneration, bilateral: Secondary | ICD-10-CM

## 2023-08-10 DIAGNOSIS — H353231 Exudative age-related macular degeneration, bilateral, with active choroidal neovascularization: Secondary | ICD-10-CM

## 2023-08-10 DIAGNOSIS — I10 Essential (primary) hypertension: Secondary | ICD-10-CM | POA: Diagnosis not present

## 2023-08-10 DIAGNOSIS — H4423 Degenerative myopia, bilateral: Secondary | ICD-10-CM | POA: Diagnosis not present

## 2023-08-12 ENCOUNTER — Encounter (INDEPENDENT_AMBULATORY_CARE_PROVIDER_SITE_OTHER): Payer: Medicare Other | Admitting: Ophthalmology

## 2023-08-12 DIAGNOSIS — H353221 Exudative age-related macular degeneration, left eye, with active choroidal neovascularization: Secondary | ICD-10-CM | POA: Diagnosis not present

## 2023-09-21 ENCOUNTER — Encounter (INDEPENDENT_AMBULATORY_CARE_PROVIDER_SITE_OTHER): Payer: Medicare Other | Admitting: Ophthalmology

## 2023-09-21 DIAGNOSIS — H338 Other retinal detachments: Secondary | ICD-10-CM | POA: Diagnosis not present

## 2023-09-21 DIAGNOSIS — H43813 Vitreous degeneration, bilateral: Secondary | ICD-10-CM | POA: Diagnosis not present

## 2023-09-21 DIAGNOSIS — H353231 Exudative age-related macular degeneration, bilateral, with active choroidal neovascularization: Secondary | ICD-10-CM | POA: Diagnosis not present

## 2023-09-21 DIAGNOSIS — H4423 Degenerative myopia, bilateral: Secondary | ICD-10-CM | POA: Diagnosis not present

## 2023-09-21 DIAGNOSIS — I10 Essential (primary) hypertension: Secondary | ICD-10-CM | POA: Diagnosis not present

## 2023-09-21 DIAGNOSIS — H35033 Hypertensive retinopathy, bilateral: Secondary | ICD-10-CM | POA: Diagnosis not present

## 2023-09-28 ENCOUNTER — Encounter (INDEPENDENT_AMBULATORY_CARE_PROVIDER_SITE_OTHER): Payer: Medicare Other | Admitting: Ophthalmology

## 2023-09-28 DIAGNOSIS — H353222 Exudative age-related macular degeneration, left eye, with inactive choroidal neovascularization: Secondary | ICD-10-CM | POA: Diagnosis not present

## 2023-10-12 DIAGNOSIS — I4821 Permanent atrial fibrillation: Secondary | ICD-10-CM | POA: Diagnosis not present

## 2023-10-12 DIAGNOSIS — M5416 Radiculopathy, lumbar region: Secondary | ICD-10-CM | POA: Diagnosis not present

## 2023-10-12 DIAGNOSIS — I739 Peripheral vascular disease, unspecified: Secondary | ICD-10-CM | POA: Diagnosis not present

## 2023-10-12 DIAGNOSIS — E1151 Type 2 diabetes mellitus with diabetic peripheral angiopathy without gangrene: Secondary | ICD-10-CM | POA: Diagnosis not present

## 2023-10-12 DIAGNOSIS — G4733 Obstructive sleep apnea (adult) (pediatric): Secondary | ICD-10-CM | POA: Diagnosis not present

## 2023-10-12 DIAGNOSIS — F40240 Claustrophobia: Secondary | ICD-10-CM | POA: Diagnosis not present

## 2023-10-12 DIAGNOSIS — Z7901 Long term (current) use of anticoagulants: Secondary | ICD-10-CM | POA: Diagnosis not present

## 2023-10-27 ENCOUNTER — Other Ambulatory Visit: Payer: Self-pay | Admitting: Internal Medicine

## 2023-10-27 DIAGNOSIS — M5416 Radiculopathy, lumbar region: Secondary | ICD-10-CM

## 2023-11-02 ENCOUNTER — Encounter (INDEPENDENT_AMBULATORY_CARE_PROVIDER_SITE_OTHER): Admitting: Ophthalmology

## 2023-11-02 DIAGNOSIS — H35033 Hypertensive retinopathy, bilateral: Secondary | ICD-10-CM

## 2023-11-02 DIAGNOSIS — H4423 Degenerative myopia, bilateral: Secondary | ICD-10-CM

## 2023-11-02 DIAGNOSIS — H353231 Exudative age-related macular degeneration, bilateral, with active choroidal neovascularization: Secondary | ICD-10-CM | POA: Diagnosis not present

## 2023-11-02 DIAGNOSIS — H338 Other retinal detachments: Secondary | ICD-10-CM

## 2023-11-02 DIAGNOSIS — H43813 Vitreous degeneration, bilateral: Secondary | ICD-10-CM

## 2023-11-09 ENCOUNTER — Encounter (INDEPENDENT_AMBULATORY_CARE_PROVIDER_SITE_OTHER): Admitting: Ophthalmology

## 2023-11-09 DIAGNOSIS — H353221 Exudative age-related macular degeneration, left eye, with active choroidal neovascularization: Secondary | ICD-10-CM | POA: Diagnosis not present

## 2023-11-16 ENCOUNTER — Ambulatory Visit
Admission: RE | Admit: 2023-11-16 | Discharge: 2023-11-16 | Disposition: A | Discharge status: Home / Self Care | Source: Ambulatory Visit | Attending: Internal Medicine | Admitting: Internal Medicine

## 2023-11-16 DIAGNOSIS — M48061 Spinal stenosis, lumbar region without neurogenic claudication: Secondary | ICD-10-CM | POA: Diagnosis not present

## 2023-11-16 DIAGNOSIS — M4316 Spondylolisthesis, lumbar region: Secondary | ICD-10-CM | POA: Diagnosis not present

## 2023-11-16 DIAGNOSIS — M47816 Spondylosis without myelopathy or radiculopathy, lumbar region: Secondary | ICD-10-CM | POA: Diagnosis not present

## 2023-11-16 DIAGNOSIS — M5416 Radiculopathy, lumbar region: Secondary | ICD-10-CM

## 2023-11-16 DIAGNOSIS — M5126 Other intervertebral disc displacement, lumbar region: Secondary | ICD-10-CM | POA: Diagnosis not present

## 2023-12-03 ENCOUNTER — Other Ambulatory Visit: Payer: Self-pay | Admitting: Cardiovascular Disease

## 2023-12-03 DIAGNOSIS — I482 Chronic atrial fibrillation, unspecified: Secondary | ICD-10-CM

## 2023-12-04 NOTE — Telephone Encounter (Signed)
 Prescription refill request for Xarelto  received.  Indication:AFIB Last office visit:1/25 Weight:83.4 kg Age:83 Scr:0.86  7/24 CrCl:79.47  ml/min  Prescription refilled

## 2023-12-14 ENCOUNTER — Encounter (INDEPENDENT_AMBULATORY_CARE_PROVIDER_SITE_OTHER): Admitting: Ophthalmology

## 2023-12-14 DIAGNOSIS — H43813 Vitreous degeneration, bilateral: Secondary | ICD-10-CM | POA: Diagnosis not present

## 2023-12-14 DIAGNOSIS — H35033 Hypertensive retinopathy, bilateral: Secondary | ICD-10-CM

## 2023-12-14 DIAGNOSIS — I10 Essential (primary) hypertension: Secondary | ICD-10-CM

## 2023-12-14 DIAGNOSIS — H338 Other retinal detachments: Secondary | ICD-10-CM

## 2023-12-14 DIAGNOSIS — H353231 Exudative age-related macular degeneration, bilateral, with active choroidal neovascularization: Secondary | ICD-10-CM

## 2023-12-21 ENCOUNTER — Encounter (INDEPENDENT_AMBULATORY_CARE_PROVIDER_SITE_OTHER): Admitting: Ophthalmology

## 2023-12-21 DIAGNOSIS — H353221 Exudative age-related macular degeneration, left eye, with active choroidal neovascularization: Secondary | ICD-10-CM | POA: Diagnosis not present

## 2024-01-20 DIAGNOSIS — M21372 Foot drop, left foot: Secondary | ICD-10-CM | POA: Diagnosis not present

## 2024-01-21 ENCOUNTER — Other Ambulatory Visit: Payer: Self-pay

## 2024-01-21 ENCOUNTER — Telehealth: Payer: Self-pay

## 2024-01-21 DIAGNOSIS — M21372 Foot drop, left foot: Secondary | ICD-10-CM

## 2024-01-21 NOTE — Telephone Encounter (Signed)
   Pre-operative Risk Assessment    Patient Name: Craig Simon  DOB: 1941/07/06 MRN: [id number]   Date of last office visit: 07/20/23 ALEENE PASSE, MD Date of next office visit: NONE   Request for Surgical Clearance    Procedure:  LEFT  L4-L5 TFESI  Date of Surgery:  Clearance TBD       REQUEST SAYS STAT                         Surgeon:  DR DEATRICE MANUS Surgeon's Group or Practice Name:  [facility] Phone number:  [phone] Fax number:  [phone]   Type of Clearance Requested:   - Medical  - Pharmacy:  Hold Rivaroxaban  (Xarelto ) 3 DAYS PRIOR AND RESUME DAY AFTER   Type of Anesthesia:  Not Indicated   Additional requests/questions:    Signed, Lucie DELENA Ku   01/21/2024, 9:10 AM

## 2024-01-25 ENCOUNTER — Encounter (INDEPENDENT_AMBULATORY_CARE_PROVIDER_SITE_OTHER): Admitting: Ophthalmology

## 2024-01-25 DIAGNOSIS — I10 Essential (primary) hypertension: Secondary | ICD-10-CM

## 2024-01-25 DIAGNOSIS — H4423 Degenerative myopia, bilateral: Secondary | ICD-10-CM | POA: Diagnosis not present

## 2024-01-25 DIAGNOSIS — H43813 Vitreous degeneration, bilateral: Secondary | ICD-10-CM

## 2024-01-25 DIAGNOSIS — H35033 Hypertensive retinopathy, bilateral: Secondary | ICD-10-CM | POA: Diagnosis not present

## 2024-01-25 DIAGNOSIS — H353231 Exudative age-related macular degeneration, bilateral, with active choroidal neovascularization: Secondary | ICD-10-CM

## 2024-01-25 DIAGNOSIS — H338 Other retinal detachments: Secondary | ICD-10-CM | POA: Diagnosis not present

## 2024-01-26 NOTE — Telephone Encounter (Signed)
 Patient with diagnosis of afib on Xarelto  for anticoagulation.    Procedure: LEFT L4-L5 TFESI  Date of procedure: TBD   CHA2DS2-VASc Score = 4   This indicates a 4.8% annual risk of stroke. The patient's score is based upon: CHF History: 1 HTN History: 0 Diabetes History: 1 Stroke History: 0 Vascular Disease History: 0 Age Score: 2 Gender Score: 0      CrCl 91 ml/min Platelet count 120  Patient has not had an Afib/aflutter ablation within the last 3 months or DCCV within the last 30 days  Per office protocol, patient can hold Xarelto  for 3 days prior to procedure.    **This guidance is not considered finalized until pre-operative APP has relayed final recommendations.**

## 2024-01-27 ENCOUNTER — Encounter (INDEPENDENT_AMBULATORY_CARE_PROVIDER_SITE_OTHER): Admitting: Ophthalmology

## 2024-01-27 DIAGNOSIS — H353231 Exudative age-related macular degeneration, bilateral, with active choroidal neovascularization: Secondary | ICD-10-CM

## 2024-01-27 NOTE — Telephone Encounter (Signed)
    Name: Craig Simon  DOB: 04-Jan-1941  MRN: [id number]   Primary Cardiologist: Aleene Passe, MD (Inactive)     Preoperative team, please contact this patient and set up a phone call appointment for further preoperative risk assessment. Please obtain consent and complete medication review. Thank you for your help. Last seen by Dr. Passe on 07/20/2023   I confirm that guidance regarding antiplatelet and oral anticoagulation therapy has been completed and, if necessary, noted below. Patient with diagnosis of afib on Xarelto  for anticoagulation.       CHA2DS2-VASc Score = 4   This indicates a 4.8% annual risk of stroke. The patient's score is based upon: CHF History: 1 HTN History: 0 Diabetes History: 1 Stroke History: 0 Vascular Disease History: 0 Age Score: 2 Gender Score: 0     Patient has not had an Afib/aflutter ablation within the last 3 months or DCCV within the last 30 days   Per office protocol, patient can hold Xarelto  for 3 days prior to procedure.       I also confirmed the patient resides in the state of [place] . As per [facility] Medical Board telemedicine laws, the patient must reside in the state in which the provider is licensed.     [name], NP 01/27/2024, 7:38 AM [facility]

## 2024-01-27 NOTE — Telephone Encounter (Signed)
 Tried contacting patient to schedule TELEVISIT no answer left a detailed vm to call back and schedule

## 2024-01-28 ENCOUNTER — Telehealth: Payer: Self-pay

## 2024-01-28 NOTE — Telephone Encounter (Signed)
 S/W pt and scheduled TELE Preop appt for 02/05/24. Med rec and consent done

## 2024-01-28 NOTE — Telephone Encounter (Signed)
 2nd request received. Will update the surgeons office that the pt has a TELE Preop appt 02/05/24.

## 2024-01-28 NOTE — Telephone Encounter (Signed)
 Med rec and consent done     Patient Consent for Virtual Visit        Craig Simon has provided verbal consent on 01/28/2024 for a virtual visit (video or telephone).   CONSENT FOR VIRTUAL VISIT FOR:  Craig Simon  By participating in this virtual visit I agree to the following:  I hereby voluntarily request, consent and authorize [facility] and its employed or contracted physicians, physician assistants, nurse practitioners or other licensed health care professionals (the Practitioner), to provide me with telemedicine health care services (the "Services) as deemed necessary by the treating Practitioner. I acknowledge and consent to receive the Services by the Practitioner via telemedicine. I understand that the telemedicine visit will involve communicating with the Practitioner through live audiovisual communication technology and the disclosure of certain medical information by electronic transmission. I acknowledge that I have been given the opportunity to request an in-person assessment or other available alternative prior to the telemedicine visit and am voluntarily participating in the telemedicine visit.  I understand that I have the right to withhold or withdraw my consent to the use of telemedicine in the course of my care at any time, without affecting my right to future care or treatment, and that the Practitioner or I may terminate the telemedicine visit at any time. I understand that I have the right to inspect all information obtained and/or recorded in the course of the telemedicine visit and may receive copies of available information for a reasonable fee.  I understand that some of the potential risks of receiving the Services via telemedicine include:  Delay or interruption in medical evaluation due to technological equipment failure or disruption; Information transmitted may not be sufficient (e.g. poor resolution of images) to allow for appropriate medical  decision making by the Practitioner; and/or  In rare instances, security protocols could fail, causing a breach of personal health information.  Furthermore, I acknowledge that it is my responsibility to provide information about my medical history, conditions and care that is complete and accurate to the best of my ability. I acknowledge that Practitioner's advice, recommendations, and/or decision may be based on factors not within their control, such as incomplete or inaccurate data provided by me or distortions of diagnostic images or specimens that may result from electronic transmissions. I understand that the practice of medicine is not an exact science and that Practitioner makes no warranties or guarantees regarding treatment outcomes. I acknowledge that a copy of this consent can be made available to me via my patient portal [facility] MyChart), or I can request a printed copy by calling the office of [facility].    I understand that my insurance will be billed for this visit.   I have read or had this consent read to me. I understand the contents of this consent, which adequately explains the benefits and risks of the Services being provided via telemedicine.  I have been provided ample opportunity to ask questions regarding this consent and the Services and have had my questions answered to my satisfaction. I give my informed consent for the services to be provided through the use of telemedicine in my medical care

## 2024-02-03 NOTE — Progress Notes (Unsigned)
 Virtual Visit via Telephone Note   Because of Skeeter Sheard co-morbid illnesses, he is at least at moderate risk for complications without adequate follow up.  This format is felt to be most appropriate for this patient at this time.  Due to technical limitations with video connection (technology), today's appointment will be conducted as an audio only telehealth visit, and Craig Simon verbally agreed to proceed in this manner.   All issues noted in this document were discussed and addressed.  No physical exam could be performed with this format.  Evaluation Performed:  Preoperative cardiovascular risk assessment _____________   Date:  02/03/2024   Patient ID:  Craig Simon, DOB 09/27/1940, MRN [id number] Patient Location:  Home Provider location:   Office  Primary Care Provider:  Yolande Toribio MATSU, MD Primary Cardiologist:  Aleene Passe, MD (Inactive)  Chief Complaint / Patient Profile   83 y.o. y/o male with a h/o atrial fibrillation, hyperlipidemia who is pending  LEFT L4-L5 TFESI  and presents today for telephonic preoperative cardiovascular risk assessment.  History of Present Illness    Craig Simon is a 83 y.o. male who presents via audio/video conferencing for a telehealth visit today.  Pt was last seen in cardiology clinic on 07/20/2023 by Dr. Passe.  At that time Craig Simon was doing well .  The patient is now pending procedure as outlined above. Since his last visit, he remained stable from a cardiac standpoint.  Today he denies chest pain, shortness of breath, lower extremity edema, fatigue, palpitations, melena, hematuria, hemoptysis, diaphoresis, weakness, presyncope, syncope, orthopnea, and PND.   Past Medical History    Past Medical History:  Diagnosis Date   Allergic rhinitis    Chest pain    CHF (congestive heart failure) (HCC)    Chronic anticoagulation    Chronic atrial fibrillation (HCC)    Diabetes mellitus    Hyperlipidemia     Hypothyroidism    Internal hemorrhoids without mention of complication 04/06/2001   Leg edema    Macular degeneration    Mitral regurgitation    Sleep apnea    has cpap but cannot tolerate   Past Surgical History:  Procedure Laterality Date   ADENOIDECTOMY  1949   CARDIAC CATHETERIZATION  11/03/2000   EF 40%   CARDIOVASCULAR STRESS TEST  09/20/2004   EF 56%. NO EVIDENCE OF ISCHEMIA   cornea surgery  2009   right eye   DOPPLER ECHOCARDIOGRAPHY  03/09/2001   EF 50%. MODERATE LVH   EYE SURGERY  2007   right eye; detached retina   HAMMER TOE SURGERY  07/20/12   right   TURBINATE REDUCTION  1989   US  ECHOCARDIOGRAPHY  09/10/2004   EF 55-60%    Allergies  Allergies  Allergen Reactions   Amiodarone     Other Reaction(s): Thyroid  dysfunction    Home Medications    Prior to Admission medications   Medication Sig Start Date End Date Taking? Authorizing Provider  Bevacizumab  (AVASTIN ) 100 MG/4ML SOLN Inject into the vein as directed.     [provider]  ciprofloxacin (CILOXAN) 0.3 % ophthalmic solution INSTILL ONE DROP INTO BOTH EYES 4 TIMES A DAY FOR TWO DAYS AFTER EACH MONTHLY EYE INJECTION 07/16/21   [provider]  ezetimibe (ZETIA) 10 MG tablet Take 10 mg by mouth daily.      [provider]  fish oil-omega-3 fatty acids 1000 MG capsule Take 2 g by mouth 2 (two) times daily.  [provider]  fluticasone  (FLONASE ) 50 MCG/ACT nasal spray Place 2 sprays into the nose as needed for rhinitis. 10/22/11   Yolande Toribio MATSU, MD  furosemide  (LASIX ) 40 MG tablet TAKE 1 TABLET BY MOUTH THREE TIMES A WEEK. 03/06/23   Nahser, Aleene PARAS, MD  levothyroxine (SYNTHROID, LEVOTHROID) 150 MCG tablet Take 150 mcg by mouth daily.      [provider]  lisinopril  (ZESTRIL ) 5 MG tablet TAKE 1 TABLET BY MOUTH EVERY DAY 01/06/22   Nahser, Aleene PARAS, MD  metFORMIN (GLUCOPHAGE-XR) 500 MG 24 hr tablet Take 1 tablet by mouth every evening. 09/29/21   [provider]  Multiple Vitamin (MULTIVITAMIN) tablet Take 1 tablet by mouth daily.      [provider]  multivitamin-lutein (OCUVITE-LUTEIN) CAPS Take 1 capsule by mouth daily.      [provider]  pioglitazone (ACTOS) 15 MG tablet Take 15 mg by mouth daily.      [provider]  potassium chloride  SA (KLOR-CON  M20) 20 MEQ tablet TAKE 1 TABLET BY MOUTH TWICE A DAY ON MONDAY, WEDNESDAY, AND FRIDAY 03/10/23   Nahser, Aleene PARAS, MD  rosuvastatin (CRESTOR) 20 MG tablet Take 20 mg by mouth daily.      [provider]  SF 1.1 % GEL dental gel Place 1 application  onto teeth at bedtime. 05/17/12   [provider]  tadalafil (CIALIS) 20 MG tablet Take 20 mg by mouth daily as needed for erectile dysfunction.    [provider]  XARELTO  20 MG TABS tablet TAKE 1 TABLET BY MOUTH DAILY WITH SUPPER 12/04/23   Nahser, Aleene PARAS, MD    Physical Exam    Vital Signs:  Craig Simon does not have vital signs available for review today.  Given telephonic nature of communication, physical exam is limited. AAOx3. NAD. Normal affect.  Speech and respirations are unlabored.  Accessory Clinical Findings    None  Assessment & Plan    1.  Preoperative Cardiovascular Risk Assessment: LEFT  L4-L5 TFESI   Date of Surgery:  Clearance TBD                            Surgeon:  DR DEATRICE MANUS Surgeon's Group or Practice Name:  [facility] Phone number:  [phone] Fax number:  [phone]      Primary Cardiologist: Aleene Passe, MD (Inactive)  Chart reviewed as part of pre-operative protocol coverage. Given past medical history and time since last visit, based on ACC/AHA guidelines, Craig Simon would be at acceptable risk for the planned procedure without further cardiovascular testing.   Patient was advised that if he develops new symptoms prior to surgery to contact our office to arrange a follow-up appointment.  He  verbalized understanding.  Per office protocol, patient can hold Xarelto  for 3 days prior to procedure.   I will route this recommendation to the requesting party via Epic fax function and remove from pre-op pool.       Time:   Today, I have spent 5 minutes with the patient with telehealth technology discussing medical history, symptoms, and management plan.  I spent 10 minutes reviewing patient's past cardiac history and cardiac medications.    Craig CHRISTELLA Beauvais, NP  02/03/2024, 1:24 PM

## 2024-02-05 ENCOUNTER — Ambulatory Visit: Attending: Cardiology

## 2024-02-05 DIAGNOSIS — Z01810 Encounter for preprocedural cardiovascular examination: Secondary | ICD-10-CM | POA: Diagnosis not present

## 2024-02-08 DIAGNOSIS — M21372 Foot drop, left foot: Secondary | ICD-10-CM | POA: Diagnosis not present

## 2024-02-10 DIAGNOSIS — M21372 Foot drop, left foot: Secondary | ICD-10-CM | POA: Diagnosis not present

## 2024-02-16 DIAGNOSIS — M21372 Foot drop, left foot: Secondary | ICD-10-CM | POA: Diagnosis not present

## 2024-02-18 DIAGNOSIS — M21372 Foot drop, left foot: Secondary | ICD-10-CM | POA: Diagnosis not present

## 2024-02-22 ENCOUNTER — Ambulatory Visit
Admission: RE | Admit: 2024-02-22 | Discharge: 2024-02-22 | Disposition: A | Discharge status: Home / Self Care | Source: Ambulatory Visit

## 2024-02-22 DIAGNOSIS — M5126 Other intervertebral disc displacement, lumbar region: Secondary | ICD-10-CM | POA: Diagnosis not present

## 2024-02-22 DIAGNOSIS — M47816 Spondylosis without myelopathy or radiculopathy, lumbar region: Secondary | ICD-10-CM | POA: Diagnosis not present

## 2024-02-22 DIAGNOSIS — M21372 Foot drop, left foot: Secondary | ICD-10-CM

## 2024-02-22 DIAGNOSIS — M48061 Spinal stenosis, lumbar region without neurogenic claudication: Secondary | ICD-10-CM | POA: Diagnosis not present

## 2024-02-23 DIAGNOSIS — M21372 Foot drop, left foot: Secondary | ICD-10-CM | POA: Diagnosis not present

## 2024-02-25 DIAGNOSIS — M21372 Foot drop, left foot: Secondary | ICD-10-CM | POA: Diagnosis not present

## 2024-03-01 ENCOUNTER — Telehealth: Payer: Self-pay | Admitting: Cardiology

## 2024-03-01 DIAGNOSIS — M21372 Foot drop, left foot: Secondary | ICD-10-CM | POA: Diagnosis not present

## 2024-03-01 MED ORDER — POTASSIUM CHLORIDE CRYS ER 20 MEQ PO TBCR: EXTENDED_RELEASE_TABLET | ORAL | 1 refills | Status: AC

## 2024-03-01 NOTE — Telephone Encounter (Signed)
*  STAT* If patient is at the pharmacy, call can be transferred to refill team.   1. Which medications need to be refilled? (please list name of each medication and dose if known) potassium chloride  SA (KLOR-CON  M20) 20 MEQ tablet    4. Which pharmacy/location (including street and city if local pharmacy) is medication to be sent to? [id number] - [place], [place] - [address]    5. Do they need a 30 day or 90 day supply? 90

## 2024-03-01 NOTE — Telephone Encounter (Signed)
 Pt's medication was sent to pt's pharmacy as requested. Confirmation received.

## 2024-03-02 DIAGNOSIS — M5416 Radiculopathy, lumbar region: Secondary | ICD-10-CM | POA: Diagnosis not present

## 2024-03-03 DIAGNOSIS — M21372 Foot drop, left foot: Secondary | ICD-10-CM | POA: Diagnosis not present

## 2024-03-04 DIAGNOSIS — M5126 Other intervertebral disc displacement, lumbar region: Secondary | ICD-10-CM | POA: Diagnosis not present

## 2024-03-04 DIAGNOSIS — M47816 Spondylosis without myelopathy or radiculopathy, lumbar region: Secondary | ICD-10-CM | POA: Diagnosis not present

## 2024-03-04 DIAGNOSIS — M48061 Spinal stenosis, lumbar region without neurogenic claudication: Secondary | ICD-10-CM | POA: Diagnosis not present

## 2024-03-08 ENCOUNTER — Encounter (INDEPENDENT_AMBULATORY_CARE_PROVIDER_SITE_OTHER): Admitting: Ophthalmology

## 2024-03-08 DIAGNOSIS — H338 Other retinal detachments: Secondary | ICD-10-CM | POA: Diagnosis not present

## 2024-03-08 DIAGNOSIS — I10 Essential (primary) hypertension: Secondary | ICD-10-CM | POA: Diagnosis not present

## 2024-03-08 DIAGNOSIS — H35033 Hypertensive retinopathy, bilateral: Secondary | ICD-10-CM

## 2024-03-08 DIAGNOSIS — H4423 Degenerative myopia, bilateral: Secondary | ICD-10-CM | POA: Diagnosis not present

## 2024-03-08 DIAGNOSIS — H43813 Vitreous degeneration, bilateral: Secondary | ICD-10-CM

## 2024-03-08 DIAGNOSIS — H353231 Exudative age-related macular degeneration, bilateral, with active choroidal neovascularization: Secondary | ICD-10-CM | POA: Diagnosis not present

## 2024-03-09 DIAGNOSIS — M21372 Foot drop, left foot: Secondary | ICD-10-CM | POA: Diagnosis not present

## 2024-03-14 ENCOUNTER — Encounter (INDEPENDENT_AMBULATORY_CARE_PROVIDER_SITE_OTHER): Admitting: Ophthalmology

## 2024-03-14 DIAGNOSIS — H353221 Exudative age-related macular degeneration, left eye, with active choroidal neovascularization: Secondary | ICD-10-CM | POA: Diagnosis not present

## 2024-03-15 DIAGNOSIS — M21372 Foot drop, left foot: Secondary | ICD-10-CM | POA: Diagnosis not present

## 2024-03-17 DIAGNOSIS — M21372 Foot drop, left foot: Secondary | ICD-10-CM | POA: Diagnosis not present

## 2024-03-21 DIAGNOSIS — Z23 Encounter for immunization: Secondary | ICD-10-CM | POA: Diagnosis not present

## 2024-03-21 DIAGNOSIS — M21372 Foot drop, left foot: Secondary | ICD-10-CM | POA: Diagnosis not present

## 2024-03-23 DIAGNOSIS — M21372 Foot drop, left foot: Secondary | ICD-10-CM | POA: Diagnosis not present

## 2024-03-25 ENCOUNTER — Other Ambulatory Visit: Payer: Self-pay | Admitting: Nurse Practitioner

## 2024-03-28 DIAGNOSIS — M21372 Foot drop, left foot: Secondary | ICD-10-CM | POA: Diagnosis not present

## 2024-03-30 DIAGNOSIS — M21372 Foot drop, left foot: Secondary | ICD-10-CM | POA: Diagnosis not present

## 2024-04-05 DIAGNOSIS — M21372 Foot drop, left foot: Secondary | ICD-10-CM | POA: Diagnosis not present

## 2024-04-07 DIAGNOSIS — M21372 Foot drop, left foot: Secondary | ICD-10-CM | POA: Diagnosis not present

## 2024-04-11 DIAGNOSIS — M21372 Foot drop, left foot: Secondary | ICD-10-CM | POA: Diagnosis not present

## 2024-04-18 ENCOUNTER — Encounter (INDEPENDENT_AMBULATORY_CARE_PROVIDER_SITE_OTHER): Admitting: Ophthalmology

## 2024-04-18 DIAGNOSIS — H43813 Vitreous degeneration, bilateral: Secondary | ICD-10-CM | POA: Diagnosis not present

## 2024-04-18 DIAGNOSIS — I10 Essential (primary) hypertension: Secondary | ICD-10-CM | POA: Diagnosis not present

## 2024-04-18 DIAGNOSIS — H35033 Hypertensive retinopathy, bilateral: Secondary | ICD-10-CM | POA: Diagnosis not present

## 2024-04-18 DIAGNOSIS — H442A3 Degenerative myopia with choroidal neovascularization, bilateral eye: Secondary | ICD-10-CM | POA: Diagnosis not present

## 2024-04-22 ENCOUNTER — Other Ambulatory Visit: Payer: Self-pay | Admitting: Nurse Practitioner

## 2024-04-25 ENCOUNTER — Encounter (INDEPENDENT_AMBULATORY_CARE_PROVIDER_SITE_OTHER): Admitting: Ophthalmology

## 2024-04-25 DIAGNOSIS — E7849 Other hyperlipidemia: Secondary | ICD-10-CM | POA: Diagnosis not present

## 2024-04-25 DIAGNOSIS — E039 Hypothyroidism, unspecified: Secondary | ICD-10-CM | POA: Diagnosis not present

## 2024-04-25 DIAGNOSIS — E1151 Type 2 diabetes mellitus with diabetic peripheral angiopathy without gangrene: Secondary | ICD-10-CM | POA: Diagnosis not present

## 2024-04-25 DIAGNOSIS — Z7901 Long term (current) use of anticoagulants: Secondary | ICD-10-CM | POA: Diagnosis not present

## 2024-04-25 DIAGNOSIS — H353221 Exudative age-related macular degeneration, left eye, with active choroidal neovascularization: Secondary | ICD-10-CM | POA: Diagnosis not present

## 2024-04-25 DIAGNOSIS — E785 Hyperlipidemia, unspecified: Secondary | ICD-10-CM | POA: Diagnosis not present

## 2024-04-25 DIAGNOSIS — Z125 Encounter for screening for malignant neoplasm of prostate: Secondary | ICD-10-CM | POA: Diagnosis not present

## 2024-04-29 DIAGNOSIS — M25562 Pain in left knee: Secondary | ICD-10-CM | POA: Diagnosis not present

## 2024-05-02 DIAGNOSIS — E1151 Type 2 diabetes mellitus with diabetic peripheral angiopathy without gangrene: Secondary | ICD-10-CM | POA: Diagnosis not present

## 2024-05-02 DIAGNOSIS — R82998 Other abnormal findings in urine: Secondary | ICD-10-CM | POA: Diagnosis not present

## 2024-05-04 ENCOUNTER — Other Ambulatory Visit: Payer: Self-pay | Admitting: Physician Assistant

## 2024-05-05 ENCOUNTER — Other Ambulatory Visit: Payer: Self-pay

## 2024-05-05 ENCOUNTER — Telehealth: Payer: Self-pay | Admitting: Cardiology

## 2024-05-05 MED ORDER — FUROSEMIDE 40 MG PO TABS: 40.0000 mg | ORAL_TABLET | ORAL | 3 refills | Status: AC

## 2024-05-05 NOTE — Telephone Encounter (Signed)
*  STAT* If patient is at the pharmacy, call can be transferred to refill team.   1. Which medications need to be refilled? (please list name of each medication and dose if known) furosemide  (LASIX ) 40 MG tablet    2. Would you like to learn more about the convenience, safety, & potential cost savings by using the [facility]? No   3. Are you open to using the [facility] (Type [facility]. ) No   4. Which pharmacy/location (including street and city if local pharmacy) is medication to be sent to?  [id number] - [place], [place] - [address]       5. Do they need a 30 day or 90 day supply? 90 day

## 2024-05-30 ENCOUNTER — Encounter (INDEPENDENT_AMBULATORY_CARE_PROVIDER_SITE_OTHER): Admitting: Ophthalmology

## 2024-05-30 DIAGNOSIS — H353231 Exudative age-related macular degeneration, bilateral, with active choroidal neovascularization: Secondary | ICD-10-CM

## 2024-05-30 DIAGNOSIS — H35033 Hypertensive retinopathy, bilateral: Secondary | ICD-10-CM | POA: Diagnosis not present

## 2024-05-30 DIAGNOSIS — H4423 Degenerative myopia, bilateral: Secondary | ICD-10-CM

## 2024-05-30 DIAGNOSIS — H43813 Vitreous degeneration, bilateral: Secondary | ICD-10-CM | POA: Diagnosis not present

## 2024-05-30 DIAGNOSIS — I10 Essential (primary) hypertension: Secondary | ICD-10-CM

## 2024-06-06 ENCOUNTER — Other Ambulatory Visit: Payer: Self-pay | Admitting: Pharmacist Clinician (PhC)/ Clinical Pharmacy Specialist

## 2024-06-06 ENCOUNTER — Encounter (INDEPENDENT_AMBULATORY_CARE_PROVIDER_SITE_OTHER): Admitting: Ophthalmology

## 2024-06-06 DIAGNOSIS — H353221 Exudative age-related macular degeneration, left eye, with active choroidal neovascularization: Secondary | ICD-10-CM

## 2024-06-06 DIAGNOSIS — I482 Chronic atrial fibrillation, unspecified: Secondary | ICD-10-CM

## 2024-06-06 MED ORDER — RIVAROXABAN 20 MG PO TABS: 20.0000 mg | ORAL_TABLET | Freq: Every day | ORAL | 0 refills | Status: AC

## 2024-06-06 NOTE — Telephone Encounter (Signed)
 Prescription refill request for Xarelto  received.  Indication: AF Last office visit: next appt 07/20/24 [place] Weight: 83.4 Age: 83 Scr: 59.75 (> 28 year old, will update at MD appt) CrCl: 89

## 2024-07-11 ENCOUNTER — Encounter (INDEPENDENT_AMBULATORY_CARE_PROVIDER_SITE_OTHER): Admitting: Ophthalmology

## 2024-07-11 DIAGNOSIS — H35033 Hypertensive retinopathy, bilateral: Secondary | ICD-10-CM | POA: Diagnosis not present

## 2024-07-11 DIAGNOSIS — I10 Essential (primary) hypertension: Secondary | ICD-10-CM | POA: Diagnosis not present

## 2024-07-11 DIAGNOSIS — H353231 Exudative age-related macular degeneration, bilateral, with active choroidal neovascularization: Secondary | ICD-10-CM | POA: Diagnosis not present

## 2024-07-11 DIAGNOSIS — H43813 Vitreous degeneration, bilateral: Secondary | ICD-10-CM

## 2024-07-11 DIAGNOSIS — H4423 Degenerative myopia, bilateral: Secondary | ICD-10-CM | POA: Diagnosis not present

## 2024-07-18 ENCOUNTER — Encounter (INDEPENDENT_AMBULATORY_CARE_PROVIDER_SITE_OTHER): Admitting: Ophthalmology

## 2024-07-18 DIAGNOSIS — H353221 Exudative age-related macular degeneration, left eye, with active choroidal neovascularization: Secondary | ICD-10-CM | POA: Diagnosis not present

## 2024-07-20 ENCOUNTER — Other Ambulatory Visit (HOSPITAL_COMMUNITY): Payer: Self-pay

## 2024-07-20 ENCOUNTER — Encounter: Payer: Self-pay | Admitting: Cardiology

## 2024-07-20 ENCOUNTER — Ambulatory Visit: Admitting: Cardiology

## 2024-07-20 VITALS — BP 140/70 | HR 84 | Resp 16 | Ht 74.0 in | Wt 180.6 lb

## 2024-07-20 DIAGNOSIS — I10 Essential (primary) hypertension: Secondary | ICD-10-CM | POA: Insufficient documentation

## 2024-07-20 DIAGNOSIS — I493 Ventricular premature depolarization: Secondary | ICD-10-CM | POA: Insufficient documentation

## 2024-07-20 DIAGNOSIS — I482 Chronic atrial fibrillation, unspecified: Secondary | ICD-10-CM | POA: Diagnosis present

## 2024-07-20 DIAGNOSIS — E782 Mixed hyperlipidemia: Secondary | ICD-10-CM | POA: Diagnosis not present

## 2024-07-20 MED ORDER — METOPROLOL SUCCINATE ER 25 MG PO TB24
25.0000 mg | ORAL_TABLET | Freq: Every day | ORAL | 3 refills | Status: AC
  Filled 2024-07-20: qty 90, 90d supply, fill #0

## 2024-07-20 NOTE — Progress Notes (Signed)
 " Cardiology Office Note:  .   Date:  07/20/2024  ID:  Craig Simon, DOB Dec 11, 1940, MRN [id number] PCP:  Yolande Toribio MATSU, MD  Former Cardiology Providers: Dr. Aleene Passe [facility] Providers Cardiologist:  Craig Large, DO , [facility] (established care 07/20/2024) Electrophysiologist:  None  Click to update primary MD,subspecialty MD or APP then REFRESH:1}    Chief Complaint  Patient presents with   Atrial Fibrillation   Follow-up    History of Present Illness: .   Craig Simon is a 84 y.o. Caucasian male whose past medical history and cardiovascular risk factors includes: Atrial fibrillation, documented hx of CHF, hypothyroidism, diabetes, hyperlipidemia, sleep apnea on CPAP.  Formally under the care of Dr. Aleene Passe who last saw Craig Simon back in January 84. I am seeing him for the first time to re-establishing care.   Since last office visit Craig Simon denies any anginal chest pain or heart failure symptoms.   No hospitalizations or urgent care visits for cardiovascular reasons.   He has been compliant with his medical therapy and endorses no concerns.  Does not endorse bleeding.  Physical endurance remains stable - at baseline; however less due to winter and sciatica pain.  Home SBP are better controlled ~174mmHG   Review of Systems: .   Review of Systems  Cardiovascular:  Negative for chest pain, claudication, irregular heartbeat, leg swelling, near-syncope, orthopnea, palpitations, paroxysmal nocturnal dyspnea and syncope.  Respiratory:  Negative for shortness of breath.   Hematologic/Lymphatic: Negative for bleeding problem.    Studies Reviewed:   EKG: EKG Interpretation Date/Time:  Wednesday July 20 2024 13:15:36 EST Ventricular Rate:  84 PR Interval:    QRS Duration:  106 QT Interval:  390 QTC Calculation: 460 R Axis:   110  Text Interpretation: Atrial fibrillation with premature ventricular or aberrantly conducted complexes Consider  Septal infarct (cited on or before 20-Jul-2024) Consider Lateral infarct (cited on or before 20-Jul-2024) T wave abnormality, consider inferior ischemia When compared with ECG of 14-Jan-2023 09:27, T wave inversion now evident in Inferior leads Confirmed by [name] [phone]) on 07/20/2024 1:55:53 PM  Echocardiogram: 12/24/2021 1. Left ventricular ejection fraction, by estimation, is 50 to 55%. The  left ventricle has low normal function. The left ventricle has no regional  wall motion abnormalities. Left ventricular diastolic parameters are  indeterminate.   2. Right ventricular systolic function is normal. The right ventricular  size is mildly enlarged. There is mildly elevated pulmonary artery  systolic pressure.   3. Left atrial size was severely dilated.   4. Right atrial size was severely dilated.   5. The mitral valve is degenerative. Mild mitral valve regurgitation.   6. Tricuspid valve regurgitation is mild to moderate.   7. Aortic valve regurgitation is not visualized. Aortic valve sclerosis  is present, with no evidence of aortic valve stenosis.   8. Aortic no significant aortic root/ascending aortic aneurysm.   9. The inferior vena cava is normal in size with greater than 50%  respiratory variability, suggesting right atrial pressure of 3 mmHg.   RADIOLOGY: N/A  Risk Assessment/Calculations:   Click Here to Calculate/Change CHADS2VASc Score The patient's CHADS2-VASc score is 5, indicating a 7.2% annual risk of stroke.   CHF History: Yes HTN History: Yes Diabetes History: Yes Stroke History: No Vascular Disease History: No  Labs:       Latest Ref Rng & Units 01/14/2023    9:59 AM 11/13/2014    2:13 PM 10/16/2014  4:40 PM  CBC  WBC 3.4 - 10.8 x10E3/uL 4.7  6.3  7.1   Hemoglobin 13.0 - 17.7 g/dL 87.8  86.8  86.1   Hematocrit 37.5 - 51.0 % 37.4  39.4  40.6   Platelets 150 - 450 x10E3/uL 126  106.0  121.0        Latest Ref Rng & Units 01/14/2023    9:59 AM  12/31/2021    1:39 PM 11/13/2014    2:13 PM  BMP  Glucose 70 - 99 mg/dL 863  878  881   BUN 8 - 27 mg/dL 15  14  17    Creatinine 0.76 - 1.27 mg/dL 9.13  9.17  9.08   BUN/Creat Ratio 10 - 24 17  17     Sodium 134 - 144 mmol/L 140  139  136   Potassium 3.5 - 5.2 mmol/L 4.4  4.1  3.8   Chloride 96 - 106 mmol/L 102  101  101   CO2 20 - 29 mmol/L 25  25  33   Calcium 8.6 - 10.2 mg/dL 9.0  9.4  8.9       Latest Ref Rng & Units 01/14/2023    9:59 AM 12/31/2021    1:39 PM 11/13/2014    2:13 PM  CMP  Glucose 70 - 99 mg/dL 863  878  881   BUN 8 - 27 mg/dL 15  14  17    Creatinine 0.76 - 1.27 mg/dL 9.13  9.17  9.08   Sodium 134 - 144 mmol/L 140  139  136   Potassium 3.5 - 5.2 mmol/L 4.4  4.1  3.8   Chloride 96 - 106 mmol/L 102  101  101   CO2 20 - 29 mmol/L 25  25  33   Calcium 8.6 - 10.2 mg/dL 9.0  9.4  8.9   Total Protein 6.0 - 8.5 g/dL 6.1     Total Bilirubin 0.0 - 1.2 mg/dL 0.5     Alkaline Phos 44 - 121 IU/L 60     AST 0 - 40 IU/L 13     ALT 0 - 44 IU/L 11  14      Lab Results  Component Value Date   CHOL 111 01/14/2023   HDL 58 01/14/2023   LDLCALC 36 01/14/2023   TRIG 87 01/14/2023   CHOLHDL 1.9 01/14/2023   No results for input(s): LIPOA in the last 8760 hours. No components found for: NTPROBNP No results for input(s): PROBNP in the last 8760 hours. No results for input(s): TSH in the last 8760 hours.  Physical Exam:    Today's Vitals   07/20/24 1314  BP: (!) 140/70  Pulse: 84  Resp: 16  SpO2: 91%  Weight: 180 lb 9.6 oz (81.9 kg)  Height: 6' 2 (1.88 m)   Body mass index is 23.19 kg/m. Wt Readings from Last 3 Encounters:  07/20/24 180 lb 9.6 oz (81.9 kg)  07/20/23 183 lb 12.8 oz (83.4 kg)  01/14/23 177 lb 6.4 oz (80.5 kg)    Physical Exam  Constitutional: No distress.  hemodynamically stable  Neck: No JVD present.  Cardiovascular: Normal rate, S1 normal and S2 normal. An irregularly irregular rhythm present. Occasional extrasystoles are present. Exam  reveals no gallop, no S3 and no S4.  No murmur heard. Pulmonary/Chest: Effort normal and breath sounds normal. No stridor. He has no wheezes. He has no rales.  Musculoskeletal:        General: No edema.  Cervical back: Neck supple.  Skin: Skin is warm.     Impression & Recommendation(s):  Impression:   ICD-10-CM   1. Chronic atrial fibrillation (HCC)  I48.20 EKG 12-Lead    Hemoglobin and hematocrit, blood    Basic Metabolic Panel (BMET)    metoprolol  succinate (TOPROL  XL) 25 MG 24 hr tablet    2. PVC's (premature ventricular contractions)  I49.3 metoprolol  succinate (TOPROL  XL) 25 MG 24 hr tablet    3. Mixed hyperlipidemia  E78.2     4. Essential hypertension  I10        Recommendation(s):  Chronic atrial fibrillation (HCC) Suspect he now has permanent atrial fibrillation given the chronicity. Patient does not exactly remember when he was diagnosed with A-fib but states it has been forever EKGs dating back to 2006 illustrate A-fib. Rate control: Start Toprol -XL for rate and PVC management Was on Eliquis  later transition to Xarelto  per insurance coverage Does not endorse evidence of bleeding. Will check H&H and BMP Risks, benefits, and alternatives to anticoagulation discussed  Premature ventricular contractions: Noted on surface ECG as well as prior tracing. Will start Toprol -XL 25 mg p.o. daily Monitor for now  Mixed hyperlipidemia Continue Crestor 20 mg p.o. nightly. Continue Zetia 10 mg p.o. daily Cardiology following peripherally, managed by primary care provider.  Essential hypertension Office blood pressures are acceptable but not at goal. Home blood pressures are better controlled. Continue lisinopril  5 mg p.o. daily  Patient had a very subtle EKG changes in the inferior leads.  No anginal chest pain or heart failure symptoms.  Recommended doing an echocardiogram to reevaluate LVEF as well as regional wall motion normalities; however, patient states  that he wants to hold off since he is asymptomatic.  Discussed management of at least 2 chronic comorbid conditions. Reviewed outside labs via Care Everywhere. EKG independently reviewed 07/20/2024. Reviewed prior note from Dr. Alveta July 20, 2023 Prescription drug management. Labs ordered. Coordination of care.  Orders Placed:  Orders Placed This Encounter  Procedures   Hemoglobin and hematocrit, blood   Basic Metabolic Panel (BMET)   EKG 12-Lead     Final Medication List:    Meds ordered this encounter  Medications   metoprolol  succinate (TOPROL  XL) 25 MG 24 hr tablet    Sig: Take 1 tablet (25 mg total) by mouth daily.    Dispense:  90 tablet    Refill:  3    There are no discontinued medications.  Current Medications[1]  Consent:   NA  Disposition:   1 year follow-up sooner if needed  His questions and concerns were addressed to his satisfaction. He voices understanding of the recommendations provided during this encounter.    Signed, Craig Michele HAS, [facility]  A Division of [facility] [address] [place], [place] [address]  07/20/2024     [1]  Current Outpatient Medications:    Bevacizumab  (AVASTIN ) 100 MG/4ML SOLN, Inject into the vein as directed. , Disp: , Rfl:    ciprofloxacin (CILOXAN) 0.3 % ophthalmic solution, INSTILL ONE DROP INTO BOTH EYES 4 TIMES A DAY FOR TWO DAYS AFTER EACH MONTHLY EYE INJECTION, Disp: , Rfl:    ezetimibe (ZETIA) 10 MG tablet, Take 10 mg by mouth daily.  , Disp: , Rfl:    fish oil-omega-3 fatty acids 1000 MG capsule, Take 2 g by mouth 2 (two) times daily.  , Disp: , Rfl:    fluticasone  (FLONASE ) 50 MCG/ACT nasal spray, Place 2 sprays  into the nose as needed for rhinitis., Disp: 1 g, Rfl: 12   furosemide  (LASIX ) 40 MG tablet, Take 1 tablet (40 mg total) by mouth 3 (three) times a week., Disp: 45 tablet, Rfl: 3   levothyroxine (SYNTHROID, LEVOTHROID) 150 MCG tablet, Take 150 mcg by mouth  daily.  , Disp: , Rfl:    lisinopril  (ZESTRIL ) 5 MG tablet, TAKE 1 TABLET BY MOUTH EVERY DAY, Disp: 90 tablet, Rfl: 3   metFORMIN (GLUCOPHAGE-XR) 500 MG 24 hr tablet, Take 1 tablet by mouth every evening., Disp: , Rfl:    metoprolol  succinate (TOPROL  XL) 25 MG 24 hr tablet, Take 1 tablet (25 mg total) by mouth daily., Disp: 90 tablet, Rfl: 3   Multiple Vitamin (MULTIVITAMIN) tablet, Take 1 tablet by mouth daily.  , Disp: , Rfl:    multivitamin-lutein (OCUVITE-LUTEIN) CAPS, Take 1 capsule by mouth daily.  , Disp: , Rfl:    pioglitazone (ACTOS) 15 MG tablet, Take 15 mg by mouth daily.  , Disp: , Rfl:    potassium chloride  SA (KLOR-CON  M20) 20 MEQ tablet, TAKE 1 TABLET BY MOUTH TWICE A DAY ON MONDAY, WEDNESDAY, AND FRIDAY, Disp: 75 tablet, Rfl: 1   rivaroxaban  (XARELTO ) 20 MG TABS tablet, Take 1 tablet (20 mg total) by mouth daily with supper., Disp: 90 tablet, Rfl: 0   rosuvastatin (CRESTOR) 20 MG tablet, Take 20 mg by mouth daily.  , Disp: , Rfl:    SF 1.1 % GEL dental gel, Place 1 application  onto teeth at bedtime., Disp: , Rfl:    tadalafil (CIALIS) 20 MG tablet, Take 20 mg by mouth daily as needed for erectile dysfunction., Disp: , Rfl:   "

## 2024-07-20 NOTE — Patient Instructions (Signed)
 Medication Instructions:  START taking Metoprolol  Succinate (Toprol  XL) 25 mg. Take one (1) tablet by mouth once daily in the morning *If you need a refill on your cardiac medications before your next appointment, please call your pharmacy*  Lab Work: Hemoglobin and Hematocrit, BMP today If you have labs (blood work) drawn today and your tests are completely normal, you will receive your results only by: MyChart Message (if you have MyChart) OR A paper copy in the mail If you have any lab test that is abnormal or we need to change your treatment, we will call you to review the results.  Testing/Procedures: None ordered  Follow-Up: At [facility], you and your health needs are our priority.  As part of our continuing mission to provide you with exceptional heart care, our providers are all part of one team.  This team includes your primary Cardiologist (physician) and Advanced Practice Providers or APPs (Physician Assistants and Nurse Practitioners) who all work together to provide you with the care you need, when you need it.  Your next appointment:   1 year(s)  Provider:   Madonna Large, DO    We recommend signing up for the patient portal called MyChart.  Sign up information is provided on this After Visit Summary.  MyChart is used to connect with patients for Virtual Visits (Telemedicine).  Patients are able to view lab/test results, encounter notes, upcoming appointments, etc.  Non-urgent messages can be sent to your provider as well.   To learn more about what you can do with MyChart, go to [email or web address].

## 2024-07-21 ENCOUNTER — Ambulatory Visit: Payer: Self-pay | Admitting: Cardiology

## 2024-07-21 LAB — BASIC METABOLIC PANEL WITH GFR
BUN/Creatinine Ratio: 9 — ABNORMAL LOW (ref 10–24)
BUN: 7 mg/dL — ABNORMAL LOW (ref 8–27)
CO2: 24 mmol/L (ref 20–29)
Calcium: 9.3 mg/dL (ref 8.6–10.2)
Chloride: 100 mmol/L (ref 96–106)
Creatinine, Ser: 0.79 mg/dL (ref 0.76–1.27)
Glucose: 132 mg/dL — ABNORMAL HIGH (ref 70–99)
Potassium: 3.9 mmol/L (ref 3.5–5.2)
Sodium: 141 mmol/L (ref 134–144)
eGFR: 89 mL/min/1.73

## 2024-07-21 LAB — HEMOGLOBIN AND HEMATOCRIT, BLOOD
Hematocrit: 39 % (ref 37.5–51.0)
Hemoglobin: 12.4 g/dL — ABNORMAL LOW (ref 13.0–17.7)

## 2024-07-22 NOTE — Telephone Encounter (Signed)
 Spoke with patient. Relayed Dr. Tyree note. All concerns addressed.

## 2024-08-22 ENCOUNTER — Encounter (INDEPENDENT_AMBULATORY_CARE_PROVIDER_SITE_OTHER): Admitting: Ophthalmology

## 2024-08-29 ENCOUNTER — Encounter (INDEPENDENT_AMBULATORY_CARE_PROVIDER_SITE_OTHER): Admitting: Ophthalmology
